# Patient Record
Sex: Male | Born: 1935 | Race: White | Hispanic: No | Marital: Married
Health system: Southern US, Community
[De-identification: ages and names within clinical notes are randomized; demographics above are authoritative.]

## PROBLEM LIST (undated history)

## (undated) DIAGNOSIS — I10 Essential (primary) hypertension: Secondary | ICD-10-CM

## (undated) DIAGNOSIS — I2510 Atherosclerotic heart disease of native coronary artery without angina pectoris: Secondary | ICD-10-CM

## (undated) DIAGNOSIS — N189 Chronic kidney disease, unspecified: Secondary | ICD-10-CM

## (undated) DIAGNOSIS — E119 Type 2 diabetes mellitus without complications: Secondary | ICD-10-CM

---

## 2007-08-23 ENCOUNTER — Ambulatory Visit: Payer: Self-pay | Admitting: Internal Medicine

## 2007-09-06 ENCOUNTER — Other Ambulatory Visit: Payer: Self-pay

## 2007-09-07 ENCOUNTER — Inpatient Hospital Stay: Payer: Self-pay | Admitting: Internal Medicine

## 2007-09-13 ENCOUNTER — Ambulatory Visit: Payer: Self-pay | Admitting: Internal Medicine

## 2007-09-23 ENCOUNTER — Ambulatory Visit: Payer: Self-pay | Admitting: Internal Medicine

## 2007-11-16 ENCOUNTER — Ambulatory Visit: Payer: Self-pay | Admitting: Gastroenterology

## 2010-03-10 ENCOUNTER — Ambulatory Visit: Payer: Self-pay | Admitting: Ophthalmology

## 2010-03-23 ENCOUNTER — Ambulatory Visit: Payer: Self-pay | Admitting: Ophthalmology

## 2012-09-06 ENCOUNTER — Encounter (HOSPITAL_COMMUNITY): Admission: AD | Disposition: E | Payer: Self-pay | Source: Other Acute Inpatient Hospital | Attending: Internal Medicine

## 2012-09-06 ENCOUNTER — Inpatient Hospital Stay (HOSPITAL_COMMUNITY)
Admission: AD | Admit: 2012-09-06 | Discharge: 2012-09-22 | DRG: 308 | Disposition: E | Payer: Medicare Other | Source: Other Acute Inpatient Hospital | Attending: Internal Medicine | Admitting: Internal Medicine

## 2012-09-06 ENCOUNTER — Encounter (HOSPITAL_COMMUNITY): Payer: Self-pay | Admitting: Internal Medicine

## 2012-09-06 ENCOUNTER — Inpatient Hospital Stay (HOSPITAL_COMMUNITY): Payer: Medicare Other

## 2012-09-06 ENCOUNTER — Emergency Department: Payer: Self-pay | Admitting: Emergency Medicine

## 2012-09-06 DIAGNOSIS — M3390 Dermatopolymyositis, unspecified, organ involvement unspecified: Secondary | ICD-10-CM | POA: Diagnosis present

## 2012-09-06 DIAGNOSIS — D696 Thrombocytopenia, unspecified: Secondary | ICD-10-CM | POA: Diagnosis present

## 2012-09-06 DIAGNOSIS — J9600 Acute respiratory failure, unspecified whether with hypoxia or hypercapnia: Secondary | ICD-10-CM | POA: Diagnosis present

## 2012-09-06 DIAGNOSIS — N189 Chronic kidney disease, unspecified: Secondary | ICD-10-CM

## 2012-09-06 DIAGNOSIS — J690 Pneumonitis due to inhalation of food and vomit: Secondary | ICD-10-CM | POA: Diagnosis present

## 2012-09-06 DIAGNOSIS — E119 Type 2 diabetes mellitus without complications: Secondary | ICD-10-CM | POA: Diagnosis present

## 2012-09-06 DIAGNOSIS — I517 Cardiomegaly: Secondary | ICD-10-CM

## 2012-09-06 DIAGNOSIS — I2510 Atherosclerotic heart disease of native coronary artery without angina pectoris: Secondary | ICD-10-CM

## 2012-09-06 DIAGNOSIS — N170 Acute kidney failure with tubular necrosis: Secondary | ICD-10-CM | POA: Diagnosis present

## 2012-09-06 DIAGNOSIS — R339 Retention of urine, unspecified: Secondary | ICD-10-CM | POA: Diagnosis present

## 2012-09-06 DIAGNOSIS — Z23 Encounter for immunization: Secondary | ICD-10-CM

## 2012-09-06 DIAGNOSIS — D539 Nutritional anemia, unspecified: Secondary | ICD-10-CM | POA: Diagnosis present

## 2012-09-06 DIAGNOSIS — I442 Atrioventricular block, complete: Principal | ICD-10-CM

## 2012-09-06 DIAGNOSIS — R55 Syncope and collapse: Secondary | ICD-10-CM

## 2012-09-06 DIAGNOSIS — M3313 Other dermatomyositis without myopathy: Secondary | ICD-10-CM | POA: Diagnosis present

## 2012-09-06 DIAGNOSIS — I129 Hypertensive chronic kidney disease with stage 1 through stage 4 chronic kidney disease, or unspecified chronic kidney disease: Secondary | ICD-10-CM | POA: Diagnosis present

## 2012-09-06 DIAGNOSIS — N184 Chronic kidney disease, stage 4 (severe): Secondary | ICD-10-CM | POA: Diagnosis present

## 2012-09-06 DIAGNOSIS — E872 Acidosis, unspecified: Secondary | ICD-10-CM | POA: Diagnosis present

## 2012-09-06 DIAGNOSIS — R918 Other nonspecific abnormal finding of lung field: Secondary | ICD-10-CM | POA: Diagnosis present

## 2012-09-06 DIAGNOSIS — R570 Cardiogenic shock: Secondary | ICD-10-CM | POA: Diagnosis present

## 2012-09-06 DIAGNOSIS — I10 Essential (primary) hypertension: Secondary | ICD-10-CM

## 2012-09-06 DIAGNOSIS — I959 Hypotension, unspecified: Secondary | ICD-10-CM | POA: Diagnosis present

## 2012-09-06 DIAGNOSIS — N179 Acute kidney failure, unspecified: Secondary | ICD-10-CM

## 2012-09-06 DIAGNOSIS — E873 Alkalosis: Secondary | ICD-10-CM | POA: Diagnosis present

## 2012-09-06 DIAGNOSIS — Z66 Do not resuscitate: Secondary | ICD-10-CM | POA: Diagnosis not present

## 2012-09-06 HISTORY — DX: Type 2 diabetes mellitus without complications: E11.9

## 2012-09-06 HISTORY — DX: Atherosclerotic heart disease of native coronary artery without angina pectoris: I25.10

## 2012-09-06 HISTORY — DX: Chronic kidney disease, unspecified: N18.9

## 2012-09-06 HISTORY — DX: Essential (primary) hypertension: I10

## 2012-09-06 LAB — POCT I-STAT 3, ART BLOOD GAS (G3+)
Acid-base deficit: 20 mmol/L — ABNORMAL HIGH (ref 0.0–2.0)
Acid-base deficit: 20 mmol/L — ABNORMAL HIGH (ref 0.0–2.0)
Bicarbonate: 10.4 mEq/L — ABNORMAL LOW (ref 20.0–24.0)
Bicarbonate: 8 mEq/L — ABNORMAL LOW (ref 20.0–24.0)
Bicarbonate: 7.4 mEq/L — ABNORMAL LOW (ref 20.0–24.0)
Patient temperature: 98.6
TCO2: 8 mmol/L (ref 0–100)
pCO2 arterial: 37.9 mmHg (ref 35.0–45.0)
pCO2 arterial: 26.7 mmHg — ABNORMAL LOW (ref 35.0–45.0)
pH, Arterial: 7.082 — CL (ref 7.350–7.450)
pH, Arterial: 7.147 — CL (ref 7.350–7.450)
pO2, Arterial: 234 mmHg — ABNORMAL HIGH (ref 80.0–100.0)
pO2, Arterial: 91 mmHg (ref 80.0–100.0)

## 2012-09-06 LAB — CBC WITH DIFFERENTIAL/PLATELET
Basophil %: 0.3 %
Basophils Absolute: 0 10*3/uL (ref 0.0–0.1)
Eosinophil #: 0.1 10*3/uL (ref 0.0–0.7)
Eosinophil %: 0.7 %
HCT: 27.9 % — ABNORMAL LOW (ref 39.0–52.0)
Lymphocyte %: 15.9 %
Lymphs Abs: 0.5 10*3/uL — ABNORMAL LOW (ref 0.7–4.0)
MCH: 34.8 pg — ABNORMAL HIGH (ref 26.0–34.0)
MCH: 34.7 pg — ABNORMAL HIGH (ref 26.0–34.0)
MCHC: 32.6 g/dL (ref 32.0–36.0)
MCHC: 33.3 g/dL (ref 30.0–36.0)
MCV: 107 fL — ABNORMAL HIGH (ref 80–100)
MCV: 104.1 fL — ABNORMAL HIGH (ref 78.0–100.0)
Monocyte #: 1 x10 3/mm (ref 0.2–1.0)
Monocyte %: 6.4 %
Monocytes Absolute: 0.9 10*3/uL (ref 0.1–1.0)
Monocytes Relative: 8 % (ref 3–12)
Neutro Abs: 10.2 10*3/uL — ABNORMAL HIGH (ref 1.7–7.7)
Neutrophil %: 76.7 %
RDW: 13.5 % (ref 11.5–14.5)
RDW: 12.9 % (ref 11.5–15.5)
WBC: 15.3 10*3/uL — ABNORMAL HIGH (ref 3.8–10.6)
WBC: 11.6 10*3/uL — ABNORMAL HIGH (ref 4.0–10.5)

## 2012-09-06 LAB — COMPREHENSIVE METABOLIC PANEL
ALT: 16 U/L (ref 0–53)
AST: 24 U/L (ref 0–37)
Albumin: 3.3 g/dL — ABNORMAL LOW (ref 3.4–5.0)
Albumin: 3.1 g/dL — ABNORMAL LOW (ref 3.5–5.2)
Alkaline Phosphatase: 34 U/L — ABNORMAL LOW (ref 39–117)
Anion Gap: 15 (ref 7–16)
BUN: 33 mg/dL — ABNORMAL HIGH (ref 7–18)
Bilirubin,Total: 0.2 mg/dL (ref 0.2–1.0)
Chloride: 108 mmol/L — ABNORMAL HIGH (ref 98–107)
Chloride: 106 mEq/L (ref 96–112)
Co2: 13 mmol/L — ABNORMAL LOW (ref 21–32)
Creatinine: 2.02 mg/dL — ABNORMAL HIGH (ref 0.60–1.30)
EGFR (Non-African Amer.): 30 — ABNORMAL LOW
Potassium: 6.2 mmol/L — ABNORMAL HIGH (ref 3.5–5.1)
Potassium: 6.9 mEq/L (ref 3.5–5.1)
SGPT (ALT): 16 U/L (ref 12–78)
Sodium: 136 mmol/L (ref 136–145)
Sodium: 134 mEq/L — ABNORMAL LOW (ref 135–145)
Total Protein: 5.4 g/dL — ABNORMAL LOW (ref 6.0–8.3)

## 2012-09-06 LAB — CARBOXYHEMOGLOBIN
Carboxyhemoglobin: 0.3 % — ABNORMAL LOW (ref 0.5–1.5)
Methemoglobin: 2 % — ABNORMAL HIGH (ref 0.0–1.5)
O2 Saturation: 98.9 %

## 2012-09-06 LAB — BASIC METABOLIC PANEL
BUN: 30 mg/dL — ABNORMAL HIGH (ref 6–23)
CO2: 8 mEq/L — CL (ref 19–32)
Calcium: 8.1 mg/dL — ABNORMAL LOW (ref 8.4–10.5)
Chloride: 106 mEq/L (ref 96–112)
Creatinine, Ser: 1.42 mg/dL — ABNORMAL HIGH (ref 0.50–1.35)
Glucose, Bld: 413 mg/dL — ABNORMAL HIGH (ref 70–99)

## 2012-09-06 LAB — PROTIME-INR
INR: 1
INR: 1.24 (ref 0.00–1.49)
Prothrombin Time: 13.6 secs (ref 11.5–14.7)
Prothrombin Time: 15.4 seconds — ABNORMAL HIGH (ref 11.6–15.2)

## 2012-09-06 LAB — GLUCOSE, CAPILLARY
Glucose-Capillary: 135 mg/dL — ABNORMAL HIGH (ref 70–99)
Glucose-Capillary: 289 mg/dL — ABNORMAL HIGH (ref 70–99)

## 2012-09-06 LAB — PROCALCITONIN: Procalcitonin: 0.37 ng/mL

## 2012-09-06 LAB — TSH: TSH: 1.716 u[IU]/mL (ref 0.350–4.500)

## 2012-09-06 LAB — URINE MICROSCOPIC-ADD ON

## 2012-09-06 LAB — URINALYSIS, ROUTINE W REFLEX MICROSCOPIC
Nitrite: NEGATIVE
Specific Gravity, Urine: 1.026 (ref 1.005–1.030)
Urobilinogen, UA: 0.2 mg/dL (ref 0.0–1.0)
pH: 5 (ref 5.0–8.0)

## 2012-09-06 LAB — PRO B NATRIURETIC PEPTIDE: Pro B Natriuretic peptide (BNP): 2207 pg/mL — ABNORMAL HIGH (ref 0–450)

## 2012-09-06 LAB — CK TOTAL AND CKMB (NOT AT ARMC): CK, Total: 47 U/L (ref 35–232)

## 2012-09-06 LAB — POTASSIUM: Potassium: 4.8 mmol/L (ref 3.5–5.1)

## 2012-09-06 LAB — HEMOGLOBIN A1C: Mean Plasma Glucose: 134 mg/dL — ABNORMAL HIGH (ref <117)

## 2012-09-06 LAB — TROPONIN I: Troponin I: 1.01 ng/mL (ref <0.30)

## 2012-09-06 LAB — INFLUENZA PANEL BY PCR (TYPE A & B): H1N1 flu by pcr: NOT DETECTED

## 2012-09-06 LAB — MRSA PCR SCREENING: MRSA by PCR: NEGATIVE

## 2012-09-06 SURGERY — PERMANENT PACEMAKER INSERTION
Anesthesia: LOCAL

## 2012-09-06 MED ORDER — NOREPINEPHRINE BITARTRATE 1 MG/ML IJ SOLN
2.0000 ug/min | INTRAVENOUS | Status: DC | PRN
  Administered 2012-09-06: 10 ug/min via INTRAVENOUS
  Administered 2012-09-08: 7 ug/min via INTRAVENOUS
  Filled 2012-09-06 (×3): qty 4

## 2012-09-06 MED ORDER — HEPARIN (PORCINE) IN NACL 100-0.45 UNIT/ML-% IJ SOLN
900.0000 [IU]/h | INTRAMUSCULAR | Status: DC
  Administered 2012-09-06: 1000 [IU]/h via INTRAVENOUS
  Administered 2012-09-07: 950 [IU]/h via INTRAVENOUS
  Administered 2012-09-07 – 2012-09-08 (×2): 900 [IU]/h via INTRAVENOUS
  Filled 2012-09-06 (×5): qty 250

## 2012-09-06 MED ORDER — FENTANYL CITRATE 0.05 MG/ML IJ SOLN
INTRAMUSCULAR | Status: AC
  Administered 2012-09-06: 100 ug
  Filled 2012-09-06: qty 2

## 2012-09-06 MED ORDER — DOPAMINE-DEXTROSE 3.2-5 MG/ML-% IV SOLN: 2.0000 ug/kg/min | INTRAVENOUS | Status: DC

## 2012-09-06 MED ORDER — LEVOFLOXACIN IN D5W 750 MG/150ML IV SOLN
750.0000 mg | INTRAVENOUS | Status: DC
  Administered 2012-09-06: 750 mg via INTRAVENOUS
  Filled 2012-09-06: qty 150

## 2012-09-06 MED ORDER — SODIUM CHLORIDE 0.9 % IV SOLN
25.0000 ug/h | INTRAVENOUS | Status: DC
  Administered 2012-09-06: 50 ug/h via INTRAVENOUS
  Filled 2012-09-06: qty 50

## 2012-09-06 MED ORDER — DOBUTAMINE IN D5W 4-5 MG/ML-% IV SOLN: 2.5000 ug/kg/min | INTRAVENOUS | Status: DC | PRN

## 2012-09-06 MED ORDER — SUCCINYLCHOLINE CHLORIDE 20 MG/ML IJ SOLN
INTRAMUSCULAR | Status: AC
  Filled 2012-09-06: qty 1

## 2012-09-06 MED ORDER — SODIUM CHLORIDE 0.9 % IV SOLN
2.0000 ug/min | INTRAVENOUS | Status: DC
  Administered 2012-09-06 – 2012-09-07 (×5): 10 ug/min via INTRAVENOUS
  Administered 2012-09-08: 5 ug/min via INTRAVENOUS
  Filled 2012-09-06 (×6): qty 4

## 2012-09-06 MED ORDER — HEPARIN BOLUS VIA INFUSION
4000.0000 [IU] | Freq: Once | INTRAVENOUS | Status: AC
  Administered 2012-09-06: 4000 [IU] via INTRAVENOUS
  Filled 2012-09-06: qty 4000

## 2012-09-06 MED ORDER — ETOMIDATE 2 MG/ML IV SOLN
INTRAVENOUS | Status: AC
  Administered 2012-09-06: 20 mg
  Filled 2012-09-06: qty 20

## 2012-09-06 MED ORDER — PIPERACILLIN-TAZOBACTAM 3.375 G IVPB 30 MIN
3.3750 g | Freq: Three times a day (TID) | INTRAVENOUS | Status: DC
  Administered 2012-09-06 – 2012-09-08 (×6): 3.375 g via INTRAVENOUS
  Filled 2012-09-06 (×8): qty 50

## 2012-09-06 MED ORDER — OSELTAMIVIR PHOSPHATE 6 MG/ML PO SUSR
75.0000 mg | Freq: Two times a day (BID) | ORAL | Status: DC
  Administered 2012-09-06 – 2012-09-07 (×3): 75 mg via ORAL
  Filled 2012-09-06 (×6): qty 12.5

## 2012-09-06 MED ORDER — ALBUTEROL SULFATE (5 MG/ML) 0.5% IN NEBU
2.5000 mg | INHALATION_SOLUTION | RESPIRATORY_TRACT | Status: DC | PRN
  Administered 2012-09-07: 2.5 mg via RESPIRATORY_TRACT
  Filled 2012-09-06: qty 0.5

## 2012-09-06 MED ORDER — PANTOPRAZOLE SODIUM 40 MG IV SOLR
40.0000 mg | INTRAVENOUS | Status: DC
  Administered 2012-09-06 – 2012-09-08 (×3): 40 mg via INTRAVENOUS
  Filled 2012-09-06 (×4): qty 40

## 2012-09-06 MED ORDER — INSULIN ASPART 100 UNIT/ML ~~LOC~~ SOLN
5.0000 [IU] | Freq: Once | SUBCUTANEOUS | Status: AC
  Administered 2012-09-06: 5 [IU] via INTRAVENOUS

## 2012-09-06 MED ORDER — SODIUM CHLORIDE 0.9 % IV BOLUS (SEPSIS): 500.0000 mL | INTRAVENOUS | Status: DC | PRN

## 2012-09-06 MED ORDER — OSELTAMIVIR PHOSPHATE 75 MG PO CAPS
75.0000 mg | ORAL_CAPSULE | Freq: Two times a day (BID) | ORAL | Status: DC
  Filled 2012-09-06 (×2): qty 1

## 2012-09-06 MED ORDER — SODIUM CHLORIDE 0.9 % IV BOLUS (SEPSIS)
500.0000 mL | Freq: Once | INTRAVENOUS | Status: DC
  Administered 2012-09-06: 12:00:00 via INTRAVENOUS

## 2012-09-06 MED ORDER — MIDAZOLAM HCL 2 MG/2ML IJ SOLN
1.0000 mg | INTRAMUSCULAR | Status: DC | PRN
  Administered 2012-09-06 (×6): 2 mg via INTRAVENOUS
  Administered 2012-09-08: 0.5 mg via INTRAVENOUS
  Filled 2012-09-06 (×7): qty 2

## 2012-09-06 MED ORDER — SODIUM BICARBONATE 8.4 % IV SOLN
INTRAVENOUS | Status: DC
  Administered 2012-09-06 – 2012-09-07 (×3): via INTRAVENOUS
  Filled 2012-09-06 (×6): qty 150

## 2012-09-06 MED ORDER — SODIUM CHLORIDE 0.9 % IV SOLN
INTRAVENOUS | Status: DC
  Administered 2012-09-06: 10 mL/h via INTRAVENOUS
  Administered 2012-09-07: 10:00:00 via INTRAVENOUS

## 2012-09-06 MED ORDER — SODIUM POLYSTYRENE SULFONATE 15 GM/60ML PO SUSP
30.0000 g | Freq: Once | ORAL | Status: AC
  Administered 2012-09-06: 30 g
  Filled 2012-09-06: qty 120

## 2012-09-06 MED ORDER — DEXTROSE 50 % IV SOLN
1.0000 | Freq: Once | INTRAVENOUS | Status: AC
  Administered 2012-09-06: 50 mL via INTRAVENOUS
  Filled 2012-09-06: qty 50

## 2012-09-06 MED ORDER — VANCOMYCIN HCL 1000 MG IV SOLR
750.0000 mg | Freq: Two times a day (BID) | INTRAVENOUS | Status: DC
  Filled 2012-09-06: qty 750

## 2012-09-06 MED ORDER — PIPERACILLIN-TAZOBACTAM 3.375 G IVPB
3.3750 g | Freq: Three times a day (TID) | INTRAVENOUS | Status: DC
  Filled 2012-09-06: qty 50

## 2012-09-06 MED ORDER — INSULIN ASPART 100 UNIT/ML ~~LOC~~ SOLN: 1.0000 [IU] | SUBCUTANEOUS | Status: DC

## 2012-09-06 MED ORDER — INSULIN ASPART 100 UNIT/ML ~~LOC~~ SOLN
0.0000 [IU] | SUBCUTANEOUS | Status: DC
  Administered 2012-09-06: 9 [IU] via SUBCUTANEOUS
  Administered 2012-09-06 (×2): 5 [IU] via SUBCUTANEOUS
  Administered 2012-09-06: 7 [IU] via SUBCUTANEOUS
  Administered 2012-09-06: 9 [IU] via SUBCUTANEOUS
  Administered 2012-09-07 (×3): 5 [IU] via SUBCUTANEOUS
  Administered 2012-09-07: 3 [IU] via SUBCUTANEOUS
  Administered 2012-09-07 – 2012-09-08 (×2): 5 [IU] via SUBCUTANEOUS
  Administered 2012-09-08: 7 [IU] via SUBCUTANEOUS
  Administered 2012-09-08 (×2): 3 [IU] via SUBCUTANEOUS
  Administered 2012-09-08 (×3): 5 [IU] via SUBCUTANEOUS
  Administered 2012-09-09: 2 [IU] via SUBCUTANEOUS
  Administered 2012-09-09: 5 [IU] via SUBCUTANEOUS

## 2012-09-06 MED ORDER — SODIUM CHLORIDE 0.9 % IV SOLN
1.0000 g | Freq: Once | INTRAVENOUS | Status: AC
  Administered 2012-09-06: 1 g via INTRAVENOUS
  Filled 2012-09-06: qty 10

## 2012-09-06 MED ORDER — LIDOCAINE HCL 2 % EX GEL
Freq: Once | CUTANEOUS | Status: AC
  Administered 2012-09-06: 5 via TOPICAL
  Filled 2012-09-06: qty 5

## 2012-09-06 MED ORDER — LEVOFLOXACIN IN D5W 750 MG/150ML IV SOLN
750.0000 mg | INTRAVENOUS | Status: DC
  Administered 2012-09-08: 750 mg via INTRAVENOUS
  Filled 2012-09-06: qty 150

## 2012-09-06 MED ORDER — FENTANYL CITRATE 0.05 MG/ML IJ SOLN
25.0000 ug | INTRAMUSCULAR | Status: DC | PRN
  Administered 2012-09-06 (×4): 50 ug via INTRAVENOUS
  Administered 2012-09-08: 25 ug via INTRAVENOUS
  Filled 2012-09-06 (×5): qty 2

## 2012-09-06 MED ORDER — ROCURONIUM BROMIDE 50 MG/5ML IV SOLN
INTRAVENOUS | Status: AC
  Filled 2012-09-06: qty 2

## 2012-09-06 MED ORDER — HYDROCORTISONE SOD SUCCINATE 100 MG IJ SOLR
50.0000 mg | Freq: Four times a day (QID) | INTRAMUSCULAR | Status: DC
  Administered 2012-09-06: 50 mg via INTRAVENOUS
  Administered 2012-09-06: 10:00:00 via INTRAVENOUS
  Administered 2012-09-06 – 2012-09-07 (×5): 50 mg via INTRAVENOUS
  Filled 2012-09-06 (×9): qty 1

## 2012-09-06 MED ORDER — ASPIRIN 325 MG PO TABS
325.0000 mg | ORAL_TABLET | Freq: Every day | ORAL | Status: DC
  Administered 2012-09-06 – 2012-09-08 (×3): 325 mg
  Filled 2012-09-06 (×4): qty 1

## 2012-09-06 MED ORDER — LIDOCAINE HCL (CARDIAC) 20 MG/ML IV SOLN
INTRAVENOUS | Status: AC
  Filled 2012-09-06: qty 5

## 2012-09-06 MED ORDER — VANCOMYCIN HCL 10 G IV SOLR
1250.0000 mg | Freq: Once | INTRAVENOUS | Status: AC
  Administered 2012-09-06: 1250 mg via INTRAVENOUS
  Filled 2012-09-06: qty 1250

## 2012-09-06 MED ORDER — VANCOMYCIN HCL 10 G IV SOLR
1250.0000 mg | INTRAVENOUS | Status: DC
  Administered 2012-09-07: 1250 mg via INTRAVENOUS
  Filled 2012-09-06 (×2): qty 1250

## 2012-09-06 MED ORDER — ASPIRIN 300 MG RE SUPP
300.0000 mg | Freq: Every day | RECTAL | Status: DC
  Filled 2012-09-06: qty 1

## 2012-09-06 MED ORDER — MIDAZOLAM HCL 2 MG/2ML IJ SOLN
INTRAMUSCULAR | Status: AC
  Administered 2012-09-06: 2 mg
  Filled 2012-09-06: qty 2

## 2012-09-06 MED FILL — Dopamine in Dextrose 5% Inj 3.2 MG/ML: INTRAVENOUS | Qty: 250 | Status: AC

## 2012-09-06 NOTE — Progress Notes (Signed)
Patient ID: William Collins, male   DOB: September 08, 1926, 79 y.o.   MRN: [id number] William Collins is an 79 y.o. male.   Chief Complaint: CHB HPI: The patient is an 79 year old man who presented to the hospital with hypotension and complete heart block after experiencing an episode of syncope. According to his wife, he has been feeling poorly for the last week. He did not seek medical attention. Earlier this morning, he got up to go to the bathroom and collapsed. He was found by paramedics to have complete heart block with a heart rate of 30 beats per minute. He was taken to the emergency department and a transcutaneous pacemaker was placed. There is intermittent capture. He remained hypotensive. I was called to see the patient where he was found to be poorly responsive, withdrawing to pain and not answering questions. Laboratory evaluation demonstrated an elevated creatinine. He underwent emergent temporary transvenous pacemaker insertion by way of the right internal jugular vein with fluoroscopic guidance. He was transferred to this institution for additional evaluation. He remains hypotensive despite satisfactory pacemaker function. Our critical care medicine services help with the patient, placing central venous access and arterial monitoring. Because of failure to protect his airway and acidemia, he underwent insertion of an endotracheal tube. He remains critically ill.  Past Medical History  Diagnosis Date  . HTN (hypertension)   . CKD (chronic kidney disease)   . DM (diabetes mellitus)   . CAD (coronary artery disease)     No past surgical history on file.  No family history on file. Social History:  does not have a smoking history on file. He does not have any smokeless tobacco history on file. His alcohol and drug histories not on file.  Allergies: No Known Allergies  Medications Prior to Admission  Medication Sig Dispense Refill  . acetaminophen (TYLENOL) 325 MG tablet Take 650 mg by mouth  every 4 (four) hours as needed. For pain      . alendronate (FOSAMAX) 70 MG tablet Take 70 mg by mouth every 7 (seven) days. Take with a full glass of water on an empty stomach.      Marland Kitchen aspirin 81 MG tablet Take 81 mg by mouth daily.      . Cholecalciferol 10000 UNITS CAPS Take 1,000 Units by mouth daily.      Marland Kitchen glipiZIDE (GLUCOTROL) 10 MG tablet Take 10 mg by mouth 2 (two) times daily before a meal.      . lisinopril (PRINIVIL,ZESTRIL) 5 MG tablet Take 5 mg by mouth daily.      . metFORMIN (GLUCOPHAGE) 1000 MG tablet Take 1,000 mg by mouth 2 (two) times daily with a meal.      . metoprolol (LOPRESSOR) 50 MG tablet Take 50 mg by mouth 2 (two) times daily.      . nitroGLYCERIN (NITROSTAT) 0.4 MG SL tablet Place 0.4 mg under the tongue every 5 (five) minutes as needed.      . nortriptyline (PAMELOR) 25 MG capsule Take 25 mg by mouth at bedtime.      Marland Kitchen omeprazole (PRILOSEC) 20 MG capsule Take 20 mg by mouth daily.      . simvastatin (ZOCOR) 80 MG tablet Take 80 mg by mouth at bedtime.      . traZODone (DESYREL) 100 MG tablet Take 100 mg by mouth at bedtime.        Results for orders placed during the hospital encounter of 2012-10-04 (from the past 48 hour(s))  MRSA PCR  SCREENING     Status: Normal   Collection Time   08/30/2012  7:16 AM      Component Value Range Comment   MRSA by PCR NEGATIVE  NEGATIVE   POCT I-STAT 3, BLOOD GAS (G3+)     Status: Abnormal   Collection Time   09/08/2012  8:08 AM      Component Value Range Comment   pH, Arterial 7.048 (*) 7.350 - 7.450    pCO2 arterial 37.9  35.0 - 45.0 mmHg    pO2, Arterial 234.0 (*) 80.0 - 100.0 mmHg    Bicarbonate 10.4 (*) 20.0 - 24.0 mEq/L    TCO2 12  0 - 100 mmol/L    O2 Saturation 99.0      Acid-base deficit 19.0 (*) 0.0 - 2.0 mmol/L    Patient temperature 98.6 F      Collection site ARTERIAL LINE      Drawn by RT      Sample type ARTERIAL      Comment NOTIFIED PHYSICIAN     LACTIC ACID, PLASMA     Status: Abnormal   Collection Time    09/05/2012  8:25 AM      Component Value Range Comment   Lactic Acid, Venous 5.6 (*) 0.5 - 2.2 mmol/L   POCT I-STAT 3, BLOOD GAS (G3+)     Status: Abnormal   Collection Time   09/15/2012  9:11 AM      Component Value Range Comment   pH, Arterial 7.082 (*) 7.350 - 7.450    pCO2 arterial 26.7 (*) 35.0 - 45.0 mmHg    pO2, Arterial 91.0  80.0 - 100.0 mmHg    Bicarbonate 8.0 (*) 20.0 - 24.0 mEq/L    TCO2 9  0 - 100 mmol/L    O2 Saturation 93.0      Acid-base deficit 20.0 (*) 0.0 - 2.0 mmol/L    Patient temperature 98.6 F      Collection site ARTERIAL LINE      Drawn by RT      Sample type ARTERIAL      Comment NOTIFIED PHYSICIAN     COMPREHENSIVE METABOLIC PANEL     Status: Abnormal   Collection Time   08/24/2012  9:45 AM      Component Value Range Comment   Sodium 134 (*) 135 - 145 mEq/L    Potassium 6.9 (*) 3.5 - 5.1 mEq/L    Chloride 106  96 - 112 mEq/L    CO2 9 (*) 19 - 32 mEq/L    Glucose, Bld 357 (*) 70 - 99 mg/dL    BUN 31 (*) 6 - 23 mg/dL    Creatinine, Ser 1.47 (*) 0.50 - 1.35 mg/dL    Calcium 7.1 (*) 8.4 - 10.5 mg/dL    Total Protein 5.4 (*) 6.0 - 8.3 g/dL    Albumin 3.1 (*) 3.5 - 5.2 g/dL    AST 24  0 - 37 U/L    ALT 16  0 - 53 U/L    Alkaline Phosphatase 34 (*) 39 - 117 U/L    Total Bilirubin 0.3  0.3 - 1.2 mg/dL    GFR calc non Af Amer 41 (*) >90 mL/min    GFR calc Af Amer 47 (*) >90 mL/min   CBC WITH DIFFERENTIAL     Status: Abnormal   Collection Time   08/27/2012  9:45 AM      Component Value Range Comment   WBC 11.6 (*) 4.0 -  10.5 K/uL    RBC 2.68 (*) 4.22 - 5.81 MIL/uL    Hemoglobin 9.3 (*) 13.0 - 17.0 g/dL    HCT 16.1 (*) 09.6 - 52.0 %    MCV 104.1 (*) 78.0 - 100.0 fL    MCH 34.7 (*) 26.0 - 34.0 pg    MCHC 33.3  30.0 - 36.0 g/dL    RDW 04.5  40.9 - 81.1 %    Platelets 130 (*) 150 - 400 K/uL    Neutrophils Relative 88 (*) 43 - 77 %    Lymphocytes Relative 4 (*) 12 - 46 %    Monocytes Relative 8  3 - 12 %    Eosinophils Relative 0  0 - 5 %    Basophils Relative  0  0 - 1 %    Neutro Abs 10.2 (*) 1.7 - 7.7 K/uL    Lymphs Abs 0.5 (*) 0.7 - 4.0 K/uL    Monocytes Absolute 0.9  0.1 - 1.0 K/uL    Eosinophils Absolute 0.0  0.0 - 0.7 K/uL    Basophils Absolute 0.0  0.0 - 0.1 K/uL    RBC Morphology BURR CELLS      WBC Morphology INCREASED BANDS (>20% BANDS)      Smear Review PLATELETS APPEAR DECREASED     TROPONIN I     Status: Abnormal   Collection Time   09/05/2012  9:45 AM      Component Value Range Comment   Troponin I 1.01 (*) <0.30 ng/mL   PROTIME-INR     Status: Abnormal   Collection Time   08/30/2012  9:45 AM      Component Value Range Comment   Prothrombin Time 15.4 (*) 11.6 - 15.2 seconds    INR 1.24  0.00 - 1.49   APTT     Status: Normal   Collection Time   08/31/2012  9:45 AM      Component Value Range Comment   aPTT 24  24 - 37 seconds   PRO B NATRIURETIC PEPTIDE     Status: Abnormal   Collection Time   09/02/2012  9:45 AM      Component Value Range Comment   Pro B Natriuretic peptide (BNP) 2207.0 (*) 0 - 450 pg/mL   POCT I-STAT 3, BLOOD GAS (G3+)     Status: Abnormal   Collection Time   08/30/2012 10:22 AM      Component Value Range Comment   pH, Arterial 7.147 (*) 7.350 - 7.450    pCO2 arterial 21.3 (*) 35.0 - 45.0 mmHg    pO2, Arterial 95.0  80.0 - 100.0 mmHg    Bicarbonate 7.4 (*) 20.0 - 24.0 mEq/L    TCO2 8  0 - 100 mmol/L    O2 Saturation 95.0      Acid-base deficit 20.0 (*) 0.0 - 2.0 mmol/L    Patient temperature 98.6 F      Collection site ARTERIAL LINE      Drawn by RT      Sample type ARTERIAL      Comment NOTIFIED PHYSICIAN     GLUCOSE, CAPILLARY     Status: Abnormal   Collection Time   09/11/2012 10:41 AM      Component Value Range Comment   Glucose-Capillary 354 (*) 70 - 99 mg/dL    Dg Chest Port 1 View  09/16/2012  **ADDENDUM** CREATED: 09/08/2012 10:25:08  Discussed with referring physician.  The patient's right IJ line reflects a transvenous pacer.  As such, it is in satisfactory position, and no adjustment is necessary.   **END ADDENDUM** SIGNED BY: Charline Bills, M.D.   10-05-2012  *RADIOLOGY REPORT*  Clinical Data: New central line, bibasilar infiltrates  PORTABLE CHEST - 1 VIEW  Comparison: None.  Findings: Endotracheal tube terminates 3 cm above the carina.  Patchy right lower lobe opacity, possibly a combination of atelectasis and pleural effusion.  The heart is normal in size.  Defibrillator pad overlies the left hemithorax.  Suspected right IJ venous catheter terminates in the right ventricle.  Withdrawal approximately 12 cm is suggested.  Additional catheter tubing overlying the right clavicle is presumed to be external to the patient.  IMPRESSION: Endotracheal tube terminates 3 cm above the carina.  Suspected right IJ venous catheter terminates in the right ventricle.  Withdrawal approximately 12 cm is suggested.  Patchy right lower lobe opacity, possibly a combination of atelectasis and pleural effusion.  These results will be called to the ordering clinician or representative by the Radiologist Assistant, and communication documented in the PACS Dashboard.   Original Report Authenticated By: Charline Bills, M.D.     Unable to obtain review of systems except as already noted in the history of present illness.  Physical exam  Blood pressure 96/50, pulse 99, resp. rate 28, weight 183 lb 13.8 oz (83.4 kg), SpO2 99.00%. Ill appearing elderly man, endotracheal tube is in place. HEENT: Unremarkable Neck:  8 cm JVD, no thyromegally, temporary transvenous pacing leads are in place Lungs:  Scattered rales and rhonchi. HEART:  Regular rate rhythm, no murmurs, no rubs, no clicks Abd:  Flat, positive bowel sounds, no organomegally, no rebound, no guarding Ext:  2 plus pulses, no edema, no cyanosis, no clubbing Skin:  No rashes no nodules Neuro:  Unresponsive except to deep painful stimuli  EKG: Ventricular pacing  Assessment/Plan 1. complete heart block 2. hypotension, shock 3. renal insufficiency,  questionable acute versus acute on chronic 4. ventilatory dependent respiratory failure Rec: If the patient survives, he will need to undergo insertion of a permanent pacemaker. He is currently critically ill. His troponin is minimally elevated, but I doubt this is a primary coronary ischemic event. He is acidemic and hypotensive. Blood cultures and empiric antibiotics have been started. We will obtain a 2-D echo to evaluate his left ventricular function. We'll plan to provide overall supportive care at this point. I discussed situation with his family.  Lewayne Bunting 10/05/2012, 11:21 AM

## 2012-09-06 NOTE — Care Management Note (Signed)
    Page 1 of 1   09-16-12     10:42:11 AM   CARE MANAGEMENT NOTE Sep 16, 2012  Patient:  William Collins, William Collins   Account Number:  [id number]  Date Initiated:  2012-09-16  Documentation initiated by:  Junius Creamer  Subjective/Objective Assessment:   adm w resp failure, on vent     Action/Plan:   lives w wife   Anticipated DC Date:     Anticipated DC Plan:        DC Planning Services  CM consult      Choice offered to / List presented to:             Status of service:   Medicare Important Message given?   (If response is "NO", the following Medicare IM given date fields will be blank) Date Medicare IM given:   Date Additional Medicare IM given:    Discharge Disposition:    Per UR Regulation:  Reviewed for med. necessity/level of care/duration of stay  If discussed at Long Length of Stay Meetings, dates discussed:    Comments:  1/16 1041 debbie Ewing Fandino rn,bsn

## 2012-09-06 NOTE — Progress Notes (Signed)
Pt presented to 2900 via carelink from [facility]. Pt on dopamine at . B/p on admit 88/40. Heart rate 77( see flow shee)t pt on external pacer and placed on Zol. Dr Ladona Ridgel in to see patient. Dr Tyson Alias CCM notified and in to see pt pt pale shallow resp. Pt unstable and intubated TL central line placed in left femoral due to possible perm pacemaker placement on left subclavian. Rt arterial line placed. Pt only response to painful stimuli . See assessment.pt placed on sepsis protocal

## 2012-09-06 NOTE — Procedures (Signed)
Central Venous Catheter Insertion Procedure Note William Collins [id number] [birth year]  Procedure: Insertion of Central Venous Catheter Indications: Drug and/or fluid administration and Frequent blood sampling  Procedure Details Consent: Unable to obtain consent because of emergent medical necessity. Time Out: Verified patient identification, verified procedure, site/side was marked, verified correct patient position, special equipment/implants available, medications/allergies/relevent history reviewed, required imaging and test results available.  Performed  Maximum sterile technique was used including antiseptics, cap, gloves, gown, hand hygiene, mask and sheet. Skin prep: Chlorhexidine; local anesthetic administered A antimicrobial bonded/coated triple lumen catheter was placed in the left femoral vein due to emergent situation using the Seldinger technique.  Evaluation Blood flow good Complications: No apparent complications Patient did tolerate procedure well. Chest X-ray ordered to verify placement.  CXR: pending.  William Collins 09/15/12, 9:26 AM  US guidance Need pacer left IOj for EP  William Collins. William Alias, MD, FACP Pgr: [phone] [facility]

## 2012-09-06 NOTE — Consult Note (Signed)
Urology Consult  Referring physician:   Dr. Almyra Deforest Reason for referral:  Inability to pass foley  Chief Complaint:  Acute urinary retention  History of Present Illness:   Patient ID: William Collins, male DOB: 10-16-1926, 79 y.o. MRN: [id number]  Chief Complaint: CHB  HPI: The patient is an 79 year old man who presented to the hospital with hypotension and complete heart block after experiencing an episode of syncope. According to his wife, he has been feeling poorly for the last week. He did not seek medical attention. Earlier this morning, he got up to go to the bathroom and collapsed. He was found by paramedics to have complete heart block with a heart rate of 30 beats per minute. He was taken to the emergency department and a transcutaneous pacemaker was placed. There is intermittent capture. He remained hypotensive. I was called to see the patient where he was found to be poorly responsive, withdrawing to pain and not answering questions. Laboratory evaluation demonstrated an elevated creatinine. He underwent emergent temporary transvenous pacemaker insertion by way of the right internal jugular vein with fluoroscopic guidance. He was transferred to this institution for additional evaluation. He remains hypotensive despite satisfactory pacemaker function. Our critical care medicine services help with the patient, placing central venous access and arterial monitoring. Because of failure to protect his airway and acidemia, he underwent insertion of an endotracheal tube. He remains critically ill.  The pt had multiple attempts to pass a foley catheter without success both at [facility], and in 2900 at [facility]. He is intubated, and sedated, and is unable to give any Urologic history. He has no family member present.    Past Medical History  Diagnosis Date  . HTN (hypertension)   . CKD (chronic kidney disease)   . DM (diabetes mellitus)   . CAD (coronary artery disease)    No  past surgical history on file.  Medications: I have reviewed the patient's current medications. Allergies: No Known Allergies  No family history on file. Social History:  does not have a smoking history on file. He does not have any smokeless tobacco history on file. His alcohol and drug histories not on file.  ROS: All systems are reviewed and negative except as noted. Pt is unalel to speak. No family member is present.    Physical Exam:  Vital signs in last 24 hours: Temp:  [95.1 F (35.1 C)-99.1 F (37.3 C)] 99.1 F (37.3 C) (01/16 1600) Pulse Rate:  [80-104] 99  (01/16 1800) Resp:  [0-35] 35  (01/16 1800) BP: (78-137)/(17-71) 137/71 mmHg (01/16 1639) SpO2:  [97 %-100 %] 100 % (01/16 1800) Arterial Line BP: (96-135)/(51-71) 125/70 mmHg (01/16 1800) FiO2 (%):  [30 %-100 %] 30 % (01/16 1639) Weight:  [83.4 kg (183 lb 13.8 oz)] 83.4 kg (183 lb 13.8 oz) (01/16 0825)  Cardiovascular: Skin warm; not flushed Respiratory: Breaths quiet; no shortness of breath Abdomen: No masses Neurological: Normal sensation to touch Musculoskeletal: Normal motor function arms and legs Lymphatics: No inguinal adenopathy Skin: No rashes Genitourinary:  Uncircumcised. Normal scrotum.   Laboratory Data:  Results for orders placed during the hospital encounter of 09/17/2012 (from the past 72 hour(s))  MRSA PCR SCREENING     Status: Normal   Collection Time   08/30/2012  7:16 AM      Component Value Range Comment   MRSA by PCR NEGATIVE  NEGATIVE   POCT I-STAT 3, BLOOD GAS (G3+)     Status: Abnormal  Collection Time   09/12/2012  8:08 AM      Component Value Range Comment   pH, Arterial 7.048 (*) 7.350 - 7.450    pCO2 arterial 37.9  35.0 - 45.0 mmHg    pO2, Arterial 234.0 (*) 80.0 - 100.0 mmHg    Bicarbonate 10.4 (*) 20.0 - 24.0 mEq/L    TCO2 12  0 - 100 mmol/L    O2 Saturation 99.0      Acid-base deficit 19.0 (*) 0.0 - 2.0 mmol/L    Patient temperature 98.6 F      Collection site ARTERIAL LINE        Drawn by RT      Sample type ARTERIAL      Comment NOTIFIED PHYSICIAN     LACTIC ACID, PLASMA     Status: Abnormal   Collection Time   09/11/2012  8:25 AM      Component Value Range Comment   Lactic Acid, Venous 5.6 (*) 0.5 - 2.2 mmol/L   POCT I-STAT 3, BLOOD GAS (G3+)     Status: Abnormal   Collection Time   09/05/2012  9:11 AM      Component Value Range Comment   pH, Arterial 7.082 (*) 7.350 - 7.450    pCO2 arterial 26.7 (*) 35.0 - 45.0 mmHg    pO2, Arterial 91.0  80.0 - 100.0 mmHg    Bicarbonate 8.0 (*) 20.0 - 24.0 mEq/L    TCO2 9  0 - 100 mmol/L    O2 Saturation 93.0      Acid-base deficit 20.0 (*) 0.0 - 2.0 mmol/L    Patient temperature 98.6 F      Collection site ARTERIAL LINE      Drawn by RT      Sample type ARTERIAL      Comment NOTIFIED PHYSICIAN     TSH     Status: Normal   Collection Time   09/08/2012  9:32 AM      Component Value Range Comment   TSH 1.716  0.350 - 4.500 uIU/mL   COMPREHENSIVE METABOLIC PANEL     Status: Abnormal   Collection Time   08/29/2012  9:45 AM      Component Value Range Comment   Sodium 134 (*) 135 - 145 mEq/L    Potassium 6.9 (*) 3.5 - 5.1 mEq/L    Chloride 106  96 - 112 mEq/L    CO2 9 (*) 19 - 32 mEq/L    Glucose, Bld 357 (*) 70 - 99 mg/dL    BUN 31 (*) 6 - 23 mg/dL    Creatinine, Ser 1.61 (*) 0.50 - 1.35 mg/dL    Calcium 7.1 (*) 8.4 - 10.5 mg/dL    Total Protein 5.4 (*) 6.0 - 8.3 g/dL    Albumin 3.1 (*) 3.5 - 5.2 g/dL    AST 24  0 - 37 U/L    ALT 16  0 - 53 U/L    Alkaline Phosphatase 34 (*) 39 - 117 U/L    Total Bilirubin 0.3  0.3 - 1.2 mg/dL    GFR calc non Af Amer 41 (*) >90 mL/min    GFR calc Af Amer 47 (*) >90 mL/min   CBC WITH DIFFERENTIAL     Status: Abnormal   Collection Time   08/25/2012  9:45 AM      Component Value Range Comment   WBC 11.6 (*) 4.0 - 10.5 K/uL    RBC 2.68 (*) 4.22 - 5.81 MIL/uL  Hemoglobin 9.3 (*) 13.0 - 17.0 g/dL    HCT 16.1 (*) 09.6 - 52.0 %    MCV 104.1 (*) 78.0 - 100.0 fL    MCH 34.7 (*) 26.0 -  34.0 pg    MCHC 33.3  30.0 - 36.0 g/dL    RDW 04.5  40.9 - 81.1 %    Platelets 130 (*) 150 - 400 K/uL    Neutrophils Relative 88 (*) 43 - 77 %    Lymphocytes Relative 4 (*) 12 - 46 %    Monocytes Relative 8  3 - 12 %    Eosinophils Relative 0  0 - 5 %    Basophils Relative 0  0 - 1 %    Neutro Abs 10.2 (*) 1.7 - 7.7 K/uL    Lymphs Abs 0.5 (*) 0.7 - 4.0 K/uL    Monocytes Absolute 0.9  0.1 - 1.0 K/uL    Eosinophils Absolute 0.0  0.0 - 0.7 K/uL    Basophils Absolute 0.0  0.0 - 0.1 K/uL    RBC Morphology BURR CELLS      WBC Morphology INCREASED BANDS (>20% BANDS)      Smear Review PLATELETS APPEAR DECREASED     TROPONIN I     Status: Abnormal   Collection Time   09/18/2012  9:45 AM      Component Value Range Comment   Troponin I 1.01 (*) <0.30 ng/mL   PROTIME-INR     Status: Abnormal   Collection Time   09/18/12  9:45 AM      Component Value Range Comment   Prothrombin Time 15.4 (*) 11.6 - 15.2 seconds    INR 1.24  0.00 - 1.49   APTT     Status: Normal   Collection Time   09-18-2012  9:45 AM      Component Value Range Comment   aPTT 24  24 - 37 seconds   PRO B NATRIURETIC PEPTIDE     Status: Abnormal   Collection Time   09/18/12  9:45 AM      Component Value Range Comment   Pro B Natriuretic peptide (BNP) 2207.0 (*) 0 - 450 pg/mL   CORTISOL     Status: Normal   Collection Time   09-18-12  9:45 AM      Component Value Range Comment   Cortisol, Plasma 26.7     PROCALCITONIN     Status: Normal   Collection Time   09/18/2012  9:45 AM      Component Value Range Comment   Procalcitonin 0.37     HEMOGLOBIN A1C     Status: Abnormal   Collection Time   09-18-12  9:45 AM      Component Value Range Comment   Hemoglobin A1C 6.3 (*) <5.7 %    Mean Plasma Glucose 134 (*) <117 mg/dL   POCT I-STAT 3, BLOOD GAS (G3+)     Status: Abnormal   Collection Time   2012/09/18 10:22 AM      Component Value Range Comment   pH, Arterial 7.147 (*) 7.350 - 7.450    pCO2 arterial 21.3 (*) 35.0 - 45.0 mmHg     pO2, Arterial 95.0  80.0 - 100.0 mmHg    Bicarbonate 7.4 (*) 20.0 - 24.0 mEq/L    TCO2 8  0 - 100 mmol/L    O2 Saturation 95.0      Acid-base deficit 20.0 (*) 0.0 - 2.0 mmol/L    Patient temperature 98.6 F  Collection site ARTERIAL LINE      Drawn by RT      Sample type ARTERIAL      Comment NOTIFIED PHYSICIAN     GLUCOSE, CAPILLARY     Status: Abnormal   Collection Time   09/05/2012 10:41 AM      Component Value Range Comment   Glucose-Capillary 354 (*) 70 - 99 mg/dL   GLUCOSE, CAPILLARY     Status: Abnormal   Collection Time   09/12/2012 11:33 AM      Component Value Range Comment   Glucose-Capillary 135 (*) 70 - 99 mg/dL   BASIC METABOLIC PANEL     Status: Abnormal   Collection Time   09/21/2012 12:30 PM      Component Value Range Comment   Sodium 132 (*) 135 - 145 mEq/L    Potassium 5.6 (*) 3.5 - 5.1 mEq/L    Chloride 106  96 - 112 mEq/L    CO2 8 (*) 19 - 32 mEq/L    Glucose, Bld 413 (*) 70 - 99 mg/dL    BUN 30 (*) 6 - 23 mg/dL    Creatinine, Ser 2.13 (*) 0.50 - 1.35 mg/dL    Calcium 8.1 (*) 8.4 - 10.5 mg/dL    GFR calc non Af Amer 44 (*) >90 mL/min    GFR calc Af Amer 51 (*) >90 mL/min   GLUCOSE, CAPILLARY     Status: Abnormal   Collection Time   09/15/2012 12:38 PM      Component Value Range Comment   Glucose-Capillary 374 (*) 70 - 99 mg/dL   CARBOXYHEMOGLOBIN     Status: Abnormal   Collection Time   08/25/2012  1:20 PM      Component Value Range Comment   Total hemoglobin 8.9 (*) 13.5 - 18.0 g/dL    O2 Saturation 08.6      Carboxyhemoglobin 0.3 (*) 0.5 - 1.5 %    Methemoglobin 2.0 (*) 0.0 - 1.5 %   TROPONIN I     Status: Abnormal   Collection Time   09/08/2012  1:31 PM      Component Value Range Comment   Troponin I 5.12 (*) <0.30 ng/mL   GLUCOSE, CAPILLARY     Status: Abnormal   Collection Time   09/18/2012  3:28 PM      Component Value Range Comment   Glucose-Capillary 312 (*) 70 - 99 mg/dL    Recent Results (from the past 240 hour(s))  MRSA PCR SCREENING      Status: Normal   Collection Time   09/05/2012  7:16 AM      Component Value Range Status Comment   MRSA by PCR NEGATIVE  NEGATIVE Final    Creatinine:  Basename 09/14/2012 1230 08/27/2012 0945  CREATININE 1.42* 1.51*    Impression/Assessment:  Acute urinary retention. Pt has blood at the urethral meatus. He has had multiple failed attempts to pass foley.   Plan:    Emergency bedside cystoscopy and foley catheter passage.   Jethro Bolus I 09/04/2012, 7:33 PM

## 2012-09-06 NOTE — Progress Notes (Signed)
Attempted coude catheter insertion. Penis retracted, blood noted before insertion. Met resistance, unable to advance catheter, moderate amount of blood noted from penis on catheter tip. Unsuccessful insertion of coude catheter. Patient's assigned RN aware. Hedy Camara [id number]

## 2012-09-06 NOTE — Procedures (Signed)
Intubation Procedure Note Dervin Vore [id number] 1927-04-11  Procedure: Intubation Indications: Respiratory insufficiency  Procedure Details Consent: Unable to obtain consent because of emergent medical necessity. Time Out: Verified patient identification, verified procedure, site/side was marked, verified correct patient position, special equipment/implants available, medications/allergies/relevent history reviewed, required imaging and test results available.  Performed  Maximum sterile technique was used including gown, hand hygiene and mask.  MAC and 4    Evaluation Hemodynamic Status: Persistent hypotension treated with pressors; O2 sats: stable throughout Patient's Current Condition: unstable Complications: No apparent complications Patient did tolerate procedure well. Chest X-ray ordered to verify placement.  CXR: pending.   [name]. 08/27/2012  8.5 ETT No asp Irven [place]. Tyson Alias, MD, FACP Pgr: [phone] [facility]

## 2012-09-06 NOTE — Progress Notes (Signed)
*  PRELIMINARY RESULTS* Echocardiogram 2D Echocardiogram has been performed.  Jeryl Columbia 09/11/2012, 10:15 AM

## 2012-09-06 NOTE — Op Note (Signed)
Pre-operative diagnosis :   Acute urinary retention with multiple failed catheterization attempts  Postoperative diagnosis:  Same.   Operation:  Flexible bedside cystoscopy and passage of  19F Council catheter.   Surgeon:  Kathie Rhodes. Patsi Sears, MD  First assistant:  none  Anesthesia:   IV MO4  Preparation:  After pre-medication, the penis was prepped with betadine solution, and draped in the usual fashion. The armband was double checked. Review history:  79 yo male admitted with Complete heart block, and is intubated. He cannot be catheterized, and requires cystoscopy for direct vision catheterization.   Statement of  Likelihood of Success: Excellent. TIME-OUT observed.:  Procedure:  The flexible cystoscope was then placed through the external urethral meatus, and the pendulous urethra appeared reddened, with multiple areas of false passages. The posterior urethra appeared wnl, and the prostate had bilobar enlargement. No median lobe was identified. The bladder was entered, and the trigone was wnl, and no stone, tumor or diverticulum was seen. A 0.38 Sensor guidewire was then passed into the bladder and the scope was removed. An 56  F Council catheter was then passed atraumatically into the bladder, and 10cc of sterile water was placed into the balloon. The catheter was then placed to straight drainage.  The patient tolerated the procedure well, and the catheter was left to straight drainage.

## 2012-09-06 NOTE — Procedures (Signed)
Central Venous Catheter Insertion Procedure Note William Collins [id number] 19-Apr-1927  Procedure: Insertion of Central Venous Catheter Indications: Assessment of intravascular volume and Drug and/or fluid administration  Procedure Details Consent: Unable to obtain consent because of emergent medical necessity. Time Out: Verified patient identification, verified procedure, site/side was marked, verified correct patient position, special equipment/implants available, medications/allergies/relevent history reviewed, required imaging and test results available.  Performed  Maximum sterile technique was used including antiseptics, cap, gloves, gown, hand hygiene, mask and sheet. Skin prep: Chlorhexidine; local anesthetic administered A antimicrobial bonded/coated triple lumen catheter was NOT placed in the left internal jugular vein using the Seldinger technique. Because concerns arose from EP for ned left IJ site for perm pacer. Will obtain  cvp from rt ij , place fem.  Evaluation Blood flow good Complications: No apparent complications Patient did tolerate procedure well. Chest X-ray ordered to verify placement.  CXR: pending.  [name] 2012/09/22, 8:21 AM  [place]  Mcarthur Rossetti. Tyson Alias, MD, FACP Pgr: [phone] [facility]

## 2012-09-06 NOTE — Progress Notes (Addendum)
ANTIBIOTIC CONSULT NOTE - INITIAL  Pharmacy Consult for vancomycin/zosyn Indication: rule out pneumonia   Patient Measurements: Wt= 83.4kg  Vital Signs: BP: 94/43 mmHg (01/16 0800) Pulse Rate: 99  (01/16 0800)  Labs: No results found for this basename: WBC:3,HGB:3,PLT:3,LABCREA:3,CREATININE:3 in the last 72 hours  Microbiology: No results found for this or any previous visit (from the past 720 hour(s)).  Medical History: -DM -HTN -CAD  Medications:  -medication list pending  Assessment: 79 yo male with CHB transferred from [place] with suspected PNA to start vancomycin and zosyn. Labs are not yet available. Per noted from [place], patient noted with CKD stage IV. SCr= 1.51, CrCl~ 35-40 ml/min.  Goal of Therapy:  Vancomycin trough level 15-20 mcg/ml  Plan:  -Zosyn 3.375gm IV q8h  and vancomycin 1250mg  IV q24hr -Will change levaquin to 750mg  IV q48h -Will follow culture results, renal function and clinical progress  Harland German, Pharm D 09/05/2012 8:44 AM

## 2012-09-06 NOTE — Procedures (Signed)
Arterial Catheter Insertion Procedure Note William Collins [id number] 11/14/1926  Procedure: Insertion of Arterial Catheter  Indications: Blood pressure monitoring  Procedure Details Consent: Unable to obtain consent because of emergent medical necessity. Time Out: Verified patient identification, verified procedure, site/side was marked, verified correct patient position, special equipment/implants available, medications/allergies/relevent history reviewed, required imaging and test results available.  Performed  Maximum sterile technique was used including antiseptics, cap, gloves, gown, hand hygiene, mask and sheet. Skin prep: Chlorhexidine; local anesthetic administered 20 gauge catheter was inserted into right femoral artery using the Seldinger technique.  Evaluation Blood flow good; BP tracing good. Complications: No apparent complications.  No radial pulse  William Collins. 09/12/2012  William Collins. Tyson Alias, MD, FACP Pgr: [phone] [facility]

## 2012-09-06 NOTE — Progress Notes (Signed)
ANTICOAGULATION CONSULT NOTE - Initial Consult  Pharmacy Consult for Heparin Indication: chest pain/ACS  No Known Allergies  Patient Measurements: Height: 5\' 9"  (175.3 cm) Weight: 183 lb 13.8 oz (83.4 kg) IBW/kg (Calculated) : 70.7   Vital Signs: Temp: 98.1 F (36.7 C) (01/16 1529) Temp src: Oral (01/16 1529) BP: 137/71 mmHg (01/16 1639) Pulse Rate: 99  (01/16 1639)  Labs:  Basename 09/17/2012 1331 09/15/2012 1230 09/05/2012 0945  HGB -- -- 9.3*  HCT -- -- 27.9*  PLT -- -- 130*  APTT -- -- 24  LABPROT -- -- 15.4*  INR -- -- 1.24  HEPARINUNFRC -- -- --  CREATININE -- 1.42* 1.51*  CKTOTAL -- -- --  CKMB -- -- --  TROPONINI 5.12* -- 1.01*    Estimated Creatinine Clearance: 39.4 ml/min (by C-G formula based on Cr of 1.42).   Medical History: Past Medical History  Diagnosis Date  . HTN (hypertension)   . CKD (chronic kidney disease)   . DM (diabetes mellitus)   . CAD (coronary artery disease)     Medications:  Scheduled:    . aspirin  325 mg Per Tube Daily  . [COMPLETED] calcium chloride  IV  1 g Intravenous Once  . [COMPLETED] dextrose  1 ampule Intravenous Once  . [COMPLETED] etomidate      . [COMPLETED] fentaNYL      . hydrocortisone sodium succinate  50 mg Intravenous Q6H  . insulin aspart  0-9 Units Subcutaneous Q4H  . [COMPLETED] insulin aspart  5 Units Intravenous Once  . levofloxacin (LEVAQUIN) IV  750 mg Intravenous Q48H  . lidocaine (cardiac) 100 mg/16ml      . [COMPLETED] lidocaine   Topical Once  . [COMPLETED] midazolam      . oseltamivir  75 mg Oral BID  . pantoprazole (PROTONIX) IV  40 mg Intravenous Q24H  . piperacillin-tazobactam  3.375 g Intravenous Q8H  . rocuronium      . [COMPLETED] sodium chloride  500 mL Intravenous Once  . [COMPLETED] sodium polystyrene  30 g Per Tube Once  . succinylcholine      . [COMPLETED] vancomycin  1,250 mg Intravenous Once  . vancomycin  1,250 mg Intravenous Q24H  . [DISCONTINUED] aspirin  300 mg Rectal Daily    . [DISCONTINUED] insulin aspart  1-3 Units Subcutaneous Q4H  . [DISCONTINUED] levofloxacin (LEVAQUIN) IV  750 mg Intravenous Q24H  . [DISCONTINUED] oseltamivir  75 mg Oral BID  . [DISCONTINUED] piperacillin-tazobactam (ZOSYN)  IV  3.375 g Intravenous Q8H  . [DISCONTINUED] vancomycin  750 mg Intravenous Q12H    Assessment: 79 year old male admitted with complete heart block and hypotension, now to begin anticoagulation with Heparin for elevated troponins.  Goal of Therapy:  Heparin level 0.3-0.7 units/ml Monitor platelets by anticoagulation protocol: Yes   Plan:  Heparin 4000 units IV bolus x 1 Start Heparin infusion at 1000 units/hr Check Heparin level in 8 hours Daily Heparin level and CBC  Estella Husk, Pharm.D., BCPS Clinical Pharmacist  Phone [phone] Pager [phone] 08/23/2012, 5:26 PM

## 2012-09-06 NOTE — Consult Note (Signed)
PULMONARY  / CRITICAL CARE MEDICINE  Name: William Collins MRN: [id number] DOB: 1927-06-25    LOS: 0  REFERRING MD :  Dr Ladona Ridgel   CHIEF COMPLAINT:  Acute Respiratory failure , Shock   BRIEF PATIENT DESCRIPTION:  79 year old male with PMH significant for CAD,  DM, HTN , CKD stage IV, who was found down by wife on the floor at home with fecal material and confusion. EMS gave IV Atropin for HR in 30s . Hypotensive and complete Heart block . IV dopamine started and transcutaneous pacemake placed.  [facility] transported by [facility]. On arrival patient developed acute respiratory failure and was intubated. PCCM was consulted.   LINES / TUBES: ETT 1/16 >> Left femoral (placed to slavage left for pacer perm)1/16>> Right  femoral A line 1/16 >> Right pacer line 1/16>>>  CULTURES: Blood cx 1/16>> Resp cx 1/16 >> Urine cx 1/16 >> Resp viral panel 1/16 >> Urine leg 1/16 >> Urine strep 1/16 >>  ANTIBIOTICS: Vanc 1/16 >> Zosyn 1/16 >> Tamiflu 1/16 >>  SIGNIFICANT EVENTS:  1/16 Arrived to floor with respiratory distress and Hypotensive on Levo and Dopamine   LEVEL OF CARE:  ICU PRIMARY SERVICE:  [facility] CONSULTANTS:  PCCM  CODE STATUS: Full  DIET:  NPO DVT Px:  SCD  GI Px:  Protonix   HISTORY OF PRESENT ILLNESS:    79 year old male with PMH significant for CAD,  DM, HTN , CKD stage IV, who was found down by wife on the floor at home with fecal material and confusion. EMS gave IV Atropin for HR in 30s . Hypotensive and complete Heart block . IV dopamine started and transcutaneous pacemake placed.   ECG: complete heart block,  CXray pulmonary vascular congestion.  Crea 2, K 6.2, Sugar 320 , Hgb 9.3, Platlets 107 , Ca 8.2, Albumin 3.3  CT head: no acute findings CT spine : negative for acute findings Cxray congestion  Was given atropine, dopamine, insulin  ( 6 units ) , 3 liters of NS , Ativan   PAST MEDICAL HISTORY :  Past Medical History  Diagnosis  Date  . HTN (hypertension)   . CKD (chronic kidney disease)   . DM (diabetes mellitus)   . CAD (coronary artery disease)    No past surgical history on file. Prior to Admission medications   Not on File   No Known Allergies  FAMILY HISTORY:  No family history on file. SOCIAL HISTORY:  does not have a smoking history on file. He does not have any smokeless tobacco history on file. His alcohol and drug histories not on file.  REVIEW OF SYSTEMS:  Sedated, acidotic, high pressor needs  INTERVAL HISTORY:   VITAL SIGNS: Pulse Rate:  [80-99] 99  (01/16 0825) Resp:  [0-28] 28  (01/16 0825) BP: (78-103)/(17-53) 96/50 mmHg (01/16 0825) SpO2:  [99 %-100 %] 99 % (01/16 0825) Arterial Line BP: (97)/(57) 97/57 mmHg (01/16 0825) FiO2 (%):  [40 %-100 %] 40 % (01/16 0941) Weight:  [183 lb 13.8 oz (83.4 kg)] 183 lb 13.8 oz (83.4 kg) (01/16 0825) HEMODYNAMICS:   VENTILATOR SETTINGS: Vent Mode:  [-] PRVC FiO2 (%):  [40 %-100 %] 40 % Set Rate:  [16 bmp-35 bmp] 35 bmp Vt Set:  [600 mL] 600 mL PEEP:  [5 cmH20] 5 cmH20 Plateau Pressure:  [19 cmH20] 19 cmH20 INTAKE / OUTPUT: Intake/Output    None     PHYSICAL EXAMINATION: General: ill appearing  male. agonal Neuro:  Unconscious, moves upper ext HEENT: PERRL , MM dry  Cardiovascular:  Paced at 80 , no murmurs noted  Lungs:  Rhonchi present, decreased air movement, no wheezing  Abdomen:  Soft, BS present Musculoskeletal: moving extremities  Skin:  Intact   LABS: Cbc Pending   Chemistry Pending   Liver fxn Pending  coags penidng  Sepsis markers Pending  Cardiac markers Pending   BNP Pending   ABG  Lab 2012-10-01 0911 2012-10-01 0808  PHART 7.082* 7.048*  PCO2ART 26.7* 37.9  PO2ART 91.0 234.0*  HCO3 8.0* 10.4*  TCO2 9 12     IMAGING:  Chest xray 1/16  Clinical Data: New central line, bibasilar infiltrates  PORTABLE CHEST - 1 VIEW  Comparison: None.  Findings: Endotracheal tube terminates 3 cm above the carina.    Patchy right lower lobe opacity, possibly a combination of  atelectasis and pleural effusion.  The heart is normal in size.  Defibrillator pad overlies the left hemithorax.  Suspected right IJ venous catheter terminates in the right  ventricle. Withdrawal approximately 12 cm is suggested.  Additional catheter tubing overlying the right clavicle is presumed  to be external to the patient.  IMPRESSION:  Endotracheal tube terminates 3 cm above the carina.  Suspected right IJ venous catheter terminates in the right  ventricle. Withdrawal approximately 12 cm is suggested.  Patchy right lower lobe opacity, possibly a combination of  atelectasis and pleural effusion.  ECG: Ventricular paced rhythm   DIAGNOSES: Active Problems:  HTN (hypertension)  DM (dermatomyositis)  CKD (chronic kidney disease)  CAD (coronary artery disease)  Acute respiratory failure  Hypotension  Complete heart block  Pulmonary infiltrate   ASSESSMENT / PLAN:  PULMONARY  ASSESSMENT: Acute respiratory failure  Pulmonary infiltrate   PLAN:   Full vent support with elevated minute ventilation to compensate for acidosis Repeat ABG to ensure correcting ph Cxray in am Antibiotics and cultures as per ID section   CARDIOVASCULAR  ASSESSMENT:  Complete Heart block s/p temporal wire placement Hypotension  Ischemia ?  Hx of CAD Hx HTN  PLAN:  Dopamine gtt max 10 mcg/hr, al alpha above this, and ineffective, keep 8 mic/kg.min Levo gtt to map goals Emergent A line Increased pacer HR to 100 to increase CO ECG per cards Troponin q6hrs  Solu cortef 50 q6h until cortisol back Follow lactic acid trend Consider EGDT with infiltrates and refractory shock Asa No beta blocker Anticoagulation per cards  RENAL  ASSESSMENT:  CKD unclear stage Acidosis ph of 7 PLAN:   F/u labs,LFT Full vent support  cvp assessment   GASTROINTESTINAL  ASSESSMENT:   No acute issues  PLAN:   Monitor  ppi lft npo  HEMATOLOGIC  ASSESSMENT:  Thrombocytopenia ?  Anemia ? PLAN:  F/u CBC  scd for now  INFECTIOUS  ASSESSMENT:   Pulmonary infiltrates  Hypotension  Presumed septic shock PLAN:   Zenaida Niece and Zosyn, Tamiflu  Blood cx, Urine cx, Resp culture, Respiratory viral panel, Urine leg, Urine strep   ENDOCRINE R/o rel AI ASSESSMENT:   Stress steroids  PLAN:   SSI  F/u  Cortisol level, TSH (brady, low threshold lyme ana etc pending trop)  NEUROLOGIC  ASSESSMENT:  CT and CT neck negative from outside hospital   PLAN:   Sedated with  fentanyl and versed prn  Limit benoz CT head done  I have personally obtained a history, examined the patient, evaluated laboratory and imaging results, formulated the assessment and plan and  placed orders. CRITICAL CARE: The patient is critically ill with multiple organ systems failure and requires high complexity decision making for assessment and support, frequent evaluation and titration of therapies, application of advanced monitoring technologies and extensive interpretation of multiple databases. Critical Care Time devoted to patient care services described in this note is 85 minutes.   Mcarthur Rossetti. Tyson Alias, MD, FACP Pgr: [phone] [facility]  Pulmonary and Critical Care Medicine [facility] Pager: [phone]  09/15/2012, 10:08 AM

## 2012-09-06 NOTE — Progress Notes (Signed)
Code status discussion with family:  I discussed with Daughter William Collins ( in [place] ) and Wife William Collins about patient wishes and both noted that he  Did not want to be a ""vegetable" . He made it clear to everyone. 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 The Daughter note she was not sure about the DNR status and deferred to the wife William Collins. She underlined that whatever she would decide she would back her up.  The wife decided to continue current therapy but was clear that patient should be DNR/DNI .   I placed the order .   Almyra Deforest , MD PGY III Internal Medicine Resident  09/07/11  05:15 pm

## 2012-09-07 ENCOUNTER — Inpatient Hospital Stay (HOSPITAL_COMMUNITY): Payer: Medicare Other

## 2012-09-07 DIAGNOSIS — R918 Other nonspecific abnormal finding of lung field: Secondary | ICD-10-CM

## 2012-09-07 DIAGNOSIS — I959 Hypotension, unspecified: Secondary | ICD-10-CM

## 2012-09-07 LAB — HEPARIN LEVEL (UNFRACTIONATED)
Heparin Unfractionated: 0.72 IU/mL — ABNORMAL HIGH (ref 0.30–0.70)
Heparin Unfractionated: 0.68 IU/mL (ref 0.30–0.70)
Heparin Unfractionated: 0.62 IU/mL (ref 0.30–0.70)

## 2012-09-07 LAB — BASIC METABOLIC PANEL
BUN: 19 mg/dL (ref 6–23)
CO2: 14 mEq/L — ABNORMAL LOW (ref 19–32)
CO2: 19 mEq/L (ref 19–32)
Chloride: 107 mEq/L (ref 96–112)
Chloride: 101 mEq/L (ref 96–112)
Creatinine, Ser: 1.24 mg/dL (ref 0.50–1.35)
GFR calc Af Amer: 58 mL/min — ABNORMAL LOW (ref >90)
Glucose, Bld: 335 mg/dL — ABNORMAL HIGH (ref 70–99)
Potassium: 4.2 mEq/L (ref 3.5–5.1)

## 2012-09-07 LAB — BLOOD GAS, ARTERIAL
Bicarbonate: 14.6 mEq/L — ABNORMAL LOW (ref 20.0–24.0)
Bicarbonate: 15.4 mEq/L — ABNORMAL LOW (ref 20.0–24.0)
O2 Saturation: 99.1 %
PEEP: 5 cmH2O
Patient temperature: 98.6
TCO2: 15.2 mmol/L (ref 0–100)
TCO2: 16.1 mmol/L (ref 0–100)
pCO2 arterial: 20.1 mmHg — ABNORMAL LOW (ref 35.0–45.0)
pH, Arterial: 7.503 — ABNORMAL HIGH (ref 7.350–7.450)
pH, Arterial: 7.498 — ABNORMAL HIGH (ref 7.350–7.450)
pO2, Arterial: 133 mmHg — ABNORMAL HIGH (ref 80.0–100.0)
pO2, Arterial: 137 mmHg — ABNORMAL HIGH (ref 80.0–100.0)

## 2012-09-07 LAB — CBC WITH DIFFERENTIAL/PLATELET
Basophils Absolute: 0 10*3/uL (ref 0.0–0.1)
Basophils Relative: 0 % (ref 0–1)
Lymphocytes Relative: 9 % — ABNORMAL LOW (ref 12–46)
MCHC: 36 g/dL (ref 30.0–36.0)
Neutro Abs: 9.1 10*3/uL — ABNORMAL HIGH (ref 1.7–7.7)
Neutrophils Relative %: 84 % — ABNORMAL HIGH (ref 43–77)
Platelets: 108 10*3/uL — ABNORMAL LOW (ref 150–400)
RDW: 12.8 % (ref 11.5–15.5)
WBC: 10.8 10*3/uL — ABNORMAL HIGH (ref 4.0–10.5)

## 2012-09-07 LAB — POCT I-STAT 3, ART BLOOD GAS (G3+)
Acid-base deficit: 6 mmol/L — ABNORMAL HIGH (ref 0.0–2.0)
Bicarbonate: 18 mEq/L — ABNORMAL LOW (ref 20.0–24.0)
Bicarbonate: 19.4 mEq/L — ABNORMAL LOW (ref 20.0–24.0)
O2 Saturation: 94 %
O2 Saturation: 93 %
Patient temperature: 98
TCO2: 20 mmol/L (ref 0–100)
pCO2 arterial: 30.6 mmHg — ABNORMAL LOW (ref 35.0–45.0)
pCO2 arterial: 34.9 mmHg — ABNORMAL LOW (ref 35.0–45.0)
pO2, Arterial: 73 mmHg — ABNORMAL LOW (ref 80.0–100.0)

## 2012-09-07 LAB — GLUCOSE, CAPILLARY
Glucose-Capillary: 250 mg/dL — ABNORMAL HIGH (ref 70–99)
Glucose-Capillary: 285 mg/dL — ABNORMAL HIGH (ref 70–99)

## 2012-09-07 LAB — TROPONIN I
Troponin I: 15.98 ng/mL (ref <0.30)
Troponin I: 8.94 ng/mL (ref <0.30)

## 2012-09-07 LAB — LEGIONELLA ANTIGEN, URINE

## 2012-09-07 LAB — CBC
HCT: 26.6 % — ABNORMAL LOW (ref 39.0–52.0)
Hemoglobin: 9.2 g/dL — ABNORMAL LOW (ref 13.0–17.0)
MCH: 34.3 pg — ABNORMAL HIGH (ref 26.0–34.0)
MCHC: 34.6 g/dL (ref 30.0–36.0)

## 2012-09-07 MED ORDER — ACETAMINOPHEN 325 MG PO TABS: 650.0000 mg | ORAL_TABLET | Freq: Four times a day (QID) | ORAL | Status: DC | PRN

## 2012-09-07 MED ORDER — BIOTENE DRY MOUTH MT LIQD
15.0000 mL | Freq: Four times a day (QID) | OROMUCOSAL | Status: DC
  Administered 2012-09-07 – 2012-09-08 (×5): 15 mL via OROMUCOSAL

## 2012-09-07 MED ORDER — HYDROCORTISONE SOD SUCCINATE 100 MG IJ SOLR
50.0000 mg | Freq: Two times a day (BID) | INTRAMUSCULAR | Status: DC
  Administered 2012-09-08: 50 mg via INTRAVENOUS
  Filled 2012-09-07 (×3): qty 1

## 2012-09-07 MED ORDER — ONDANSETRON HCL 4 MG/2ML IJ SOLN
4.0000 mg | Freq: Four times a day (QID) | INTRAMUSCULAR | Status: DC | PRN
  Administered 2012-09-07: 4 mg via INTRAVENOUS
  Filled 2012-09-07: qty 2

## 2012-09-07 MED ORDER — SODIUM CHLORIDE 0.9 % IV SOLN
INTRAVENOUS | Status: DC | PRN
  Administered 2012-09-07: 07:00:00 via INTRAVENOUS

## 2012-09-07 MED ORDER — SODIUM CHLORIDE 0.9 % IV SOLN
INTRAVENOUS | Status: DC
  Administered 2012-09-07: 19:00:00 via INTRAVENOUS

## 2012-09-07 MED ORDER — PNEUMOCOCCAL VAC POLYVALENT 25 MCG/0.5ML IJ INJ
0.5000 mL | INJECTION | INTRAMUSCULAR | Status: AC
  Administered 2012-09-08: 0.5 mL via INTRAMUSCULAR
  Filled 2012-09-07: qty 0.5

## 2012-09-07 MED ORDER — CHLORHEXIDINE GLUCONATE 0.12 % MT SOLN
15.0000 mL | Freq: Two times a day (BID) | OROMUCOSAL | Status: DC
  Administered 2012-09-07 – 2012-09-08 (×4): 15 mL via OROMUCOSAL
  Filled 2012-09-07 (×4): qty 15

## 2012-09-07 MED ORDER — MAGNESIUM SULFATE 40 MG/ML IJ SOLN
2.0000 g | Freq: Once | INTRAMUSCULAR | Status: AC
  Administered 2012-09-08: 2 g via INTRAVENOUS
  Filled 2012-09-07: qty 50

## 2012-09-07 MED ORDER — DOCUSATE SODIUM 100 MG PO CAPS
100.0000 mg | ORAL_CAPSULE | Freq: Two times a day (BID) | ORAL | Status: DC | PRN
  Filled 2012-09-07: qty 1

## 2012-09-07 NOTE — Progress Notes (Signed)
Name: William Collins MRN: [id number] DOB: June 28, 1927 LOS: 1  PCCM RESIDENT DAILY PROGRESS NOTE  History of Present Illness:  79 year old male with PMH significant for CAD, DM, HTN , CKD stage IV, who was found down by wife on the floor at home with fecal material and confusion. EMS gave IV Atropin for HR in 30s . Hypotensive and complete Heart block . IV dopamine started and transcutaneous pacemake placed. Patient was transported by [facility] evaluation. On arrival patient developed acute respiratory failure and was intubated. PCCM was consulted.   LINES / TUBES:  ETT 1/16 >> 1/17 Left femoral (placed to slavage left for pacer perm)1/16>>  Right femoral A line 1/16 >> 1/17 Right pacer line 1/16>>>   CULTURES:  Blood cx 1/16>> NTD Resp cx 1/16 >> Mixed flora Urine cx 1/16 >> NTD Resp viral panel 1/16 >>  Urine leg 1/16 >> Neg Urine strep 1/16 >> negative  Influenza nasal  1/16 >>swab negative  ANTIBIOTICS:  Vanc 1/16 >>  Zosyn 1/16 >>  Tamiflu 1/16 >>  Levaquin 1/15>>>  SIGNIFICANT EVENTS:  CT head and Neck 1/16 from  [facility] : negative for acute process 1/16 Arrived to floor with respiratory distress and Hypotensive on Levo and Dopamine > intubated  1/16 After discussion  with patient's wife : DNR status  1/16 2 D echo : Left ventricle: The cavity size was normal. There appeared to be septal-lateral dyssynchrony. The mid to apical inferior wall appears akinetic, mid to apical septum severely hypokinetic, apical wall severely hypokinetic, lateral wall kinetic. Other walls poorly visualized.Systolic function was severely reduced. The estimated ejection fraction was in the range of 25% to 30%.    Overnight Events:  - Trop elevated to 20 > started on Heparin  - Patient more awake and alert  - Resp alkalosis pH 7.5 PCO218 > Decrease RR to 25  - Temp of 101.3  Vital Signs: Temp:  [95.1 F (35.1 C)-101.8 F (38.8 C)] 101.3 F (38.5 C) (01/17  0447) Pulse Rate:  [80-104] 99  (01/17 0600) Resp:  [0-35] 25  (01/17 0600) BP: (78-137)/(40-71) 137/71 mmHg (01/16 1639) SpO2:  [97 %-100 %] 100 % (01/17 0600) Arterial Line BP: (96-135)/(51-71) 114/64 mmHg (01/17 0600) FiO2 (%):  [30 %-100 %] 30 % (01/17 0340) Weight:  [183 lb 13.8 oz (83.4 kg)-190 lb 14.7 oz (86.6 kg)] 190 lb 14.7 oz (86.6 kg) (01/17 0447) I/O last 3 completed shifts: In: 3058.8 [I.V.:2338.8; NG/GT:60; IV Piggyback:660] Out: 2120 [Urine:2120]  Physical Examination: General: Awake  Neuro: Awake and Alert  HEENT:ETT in place  Cardiovascular: Paced at 100 , no murmurs noted  Lungs: Rhonchi present, decreased air movement, no wheezing  Abdomen: Soft, BS present  Ventilator settings: Vent Mode:  [-] PRVC FiO2 (%):  [30 %-100 %] 30 % Set Rate:  [16 bmp-35 bmp] 25 bmp Vt Set:  [600 mL] 600 mL PEEP:  [5 cmH20] 5 cmH20 Plateau Pressure:  [19 cmH20-29 cmH20] 26 cmH20  Basename 09/07/12 0534 09/07/12 0328 08/23/2012 1022  PHART 7.498* 7.503* 7.147*  PO2ART 137.0* 133.0* 95.0  TCO2 16.1 15.2 8  HCO3 15.4* 14.6* 7.4*   Labs and Imaging:   Basic Metabolic Panel:  Lab 09/07/12 4098 09/03/2012 1230  NA 135 132*  K 4.2 5.6*  CL 107 106  CO2 14* 8*  GLUCOSE 289* 413*  BUN 23 30*  CREATININE 1.28 1.42*  CALCIUM 7.5* 8.1*  MG -- --  PHOS -- --  Liver Function Tests:  Lab 09-25-12 0945  AST 24  ALT 16  ALKPHOS 34*  BILITOT 0.3  PROT 5.4*  ALBUMIN 3.1*    CBC:  Lab 09/07/12 0445 09/25/12 0945  WBC 10.8* 11.6*  NEUTROABS 9.1* 10.2*  HGB 8.5* 9.3*  HCT 23.6* 27.9*  MCV 96.3 104.1*  PLT 108* 130*   Cardiac Enzymes:  Lab Sep 25, 2012 2315 09/25/2012 1331 09-25-2012 0945  CKTOTAL -- -- --  CKMB -- -- --  CKMBINDEX -- -- --  TROPONINI >20.00* 5.12* 1.01*   BNP:  Lab September 25, 2012 0945  PROBNP 2207.0*    CBG:  Lab 09/07/12 0341 09-25-12 2339 2012-09-25 1941 2012/09/25 1528 September 25, 2012 1238 09/25/12 1133  GLUCAP 250* 269* 289* 312* 374* 135*   Hemoglobin  A1C:  Lab 09/25/12 0945  HGBA1C 6.3*    Thyroid Function Tests:  Lab 09-25-2012 0932  TSH 1.716  T4TOTAL --  FREET4 --  T3FREE --  THYROIDAB --   Coagulation:  Lab 25-Sep-2012 0945  LABPROT 15.4*  INR 1.24    Urinalysis:  Lab 09/25/12 2327  COLORURINE RED*  LABSPEC 1.026  PHURINE 5.0  GLUCOSEU >1000*  HGBUR LARGE*  BILIRUBINUR NEGATIVE  KETONESUR 40*  PROTEINUR 100*  UROBILINOGEN 0.2  NITRITE NEGATIVE  LEUKOCYTESUR SMALL*      Lab 09/07/12 0320 09-25-12 0945 2012-09-25 0825  LATICACIDVEN -- -- 5.6*  PROCALCITON 3.45 0.37 --    Assessment and Plan: PULMONARY  ASSESSMENT:  Acute respiratory failure  Pulmonary infiltrate  Resp alkalosis  PLAN:  SBT today to hopefully extubate Repeat ABG on SBT corrected well.  Cxray in am  Antibiotics and cultures as per ID section   CARDIOVASCULAR  ASSESSMENT:  Complete Heart block s/p temporal wire placement  Hypotension  Elevated Trop > 1 > 5 > 20  Hx of CAD  Hx HTN  Cortisol level 26 Lactic acid 5.6 PCT 03>3.4 >  CVP today 10  PLAN:  Dopamine d/c 1/16  Titrate off Levo if possible for MAP of 65  ECG   Continue to  cycle Troponin  Heparin drip  Asa  D/c Solu cortef when no longer hypotensive (cortisol level of 26 however). No beta blocker  Per Cardiology for possible permanent pacer   RENAL  ASSESSMENT:  CKD unclear stage  Acidosis ph of 7 > now Resp alkalosis > Vent changes made  Cath placed by Urology > Good urine output   PLAN:  Vent changes made Monitor renal function and urine output   GASTROINTESTINAL  ASSESSMENT:  No acute issues  PLAN:  Swallow evaluation post extubation.  HEMATOLOGIC  ASSESSMENT:  Thrombocytopenia  - 130 > 108 Anemia 9.3 > 8.5  Currently on Heparin drip  Coags on admission WNL   PLAN:  F/u CBC Transfuse if < 8   INFECTIOUS  ASSESSMENT:  Pulmonary infiltrates - Aspiration ?  Hypotension  Septic shock - unlikely  Temp of 101.3 today WBC 10.8<11.6 PLAN:    Continue Vanc, levo and Zosyn, Tamiflu for now and follow cultures : Blood cx, Urine cx, Resp culture, Respiratory viral panel, Urine leg, Urine strep   ENDOCRINE  DM , Hgb A1c 6.3  TSH 1.17  Cortisol level  26.7  ASSESSMENT:  Stress steroids  PLAN:  SSI  D/C steroids   NEUROLOGIC  ASSESSMENT:  CT and CT neck negative from outside hospital  Awake and Alert  PLAN:  Sedated with fentanyl and versed prn  Limit benoz    Best practices / Disposition: -->ICU status  under PCCM -->DNR -->Heparin gtt for DVT Px -->Protonix for GI Px -->ventilator bundle -->diet    ILLATH,JASEELA 09/07/2012, 7:40 AM  Patient weaning very well, remains on low dose pressors but anticipate that will come off once extubated.  Also increased secretions that appears purulent.  Will extubate and order swallow evaluation with pulmonary hygienes and keep under monitor.  If fails extubation in the next few hours will reintubate but after today will honor DNR.  The patient is critically ill with multiple organ systems failure and requires high complexity decision making for assessment and support, frequent evaluation and titration of therapies, application of advanced monitoring technologies and extensive interpretation of multiple databases. Critical Care Time devoted to patient care services described in this note is   35 minutes.  Patient seen and examined, agree with above note.  I dictated the care and orders written for this patient under my direction.  Koren Bound, M.D. [phone]

## 2012-09-07 NOTE — Progress Notes (Signed)
ANTICOAGULATION CONSULT NOTE - Follow Up Consult  Pharmacy Consult for Heparin Indication: chest pain/ACS  No Known Allergies  Patient Measurements: Height: 5\' 9"  (175.3 cm) Weight: 190 lb 14.7 oz (86.6 kg) IBW/kg (Calculated) : 70.7   Vital Signs: Temp: 98 F (36.7 C) (01/17 2000) Temp src: Oral (01/17 2000) BP: 94/59 mmHg (01/17 2235) Pulse Rate: 99  (01/17 2235)  Labs:  Basename 09/07/12 2217 09/07/12 2200 09/07/12 1910 09/07/12 1330 09/07/12 1200 09/07/12 0811 09/07/12 0445 09/07/12 0320 09/14/2012 1230 09/05/2012 0945  HGB 9.2* -- -- -- -- -- 8.5* -- -- --  HCT 26.6* -- -- -- -- -- 23.6* -- -- 27.9*  PLT 116* -- -- -- -- -- 108* -- -- 130*  APTT -- -- -- -- -- -- -- -- -- 24  LABPROT -- -- -- -- -- -- -- -- -- 15.4*  INR -- -- -- -- -- -- -- -- -- 1.24  HEPARINUNFRC -- 0.62 -- -- 0.68 -- -- 0.72* -- --  CREATININE -- -- -- -- -- -- -- 1.28 1.42* 1.51*  CKTOTAL -- -- -- -- -- -- -- -- -- --  CKMB -- -- -- -- -- -- -- -- -- --  TROPONINI -- -- 8.94* 10.70* -- 15.98* -- -- -- --    Estimated Creatinine Clearance: 47.7 ml/min (by C-G formula based on Cr of 1.28).   Medical History: Past Medical History  Diagnosis Date  . HTN (hypertension)   . CKD (chronic kidney disease)   . DM (diabetes mellitus)   . CAD (coronary artery disease)     Medications:  Scheduled:     . antiseptic oral rinse  15 mL Mouth Rinse QID  . aspirin  325 mg Per Tube Daily  . chlorhexidine  15 mL Mouth Rinse BID  . hydrocortisone sodium succinate  50 mg Intravenous Q6H  . insulin aspart  0-9 Units Subcutaneous Q4H  . levofloxacin (LEVAQUIN) IV  750 mg Intravenous Q48H  . oseltamivir  75 mg Oral BID  . pantoprazole (PROTONIX) IV  40 mg Intravenous Q24H  . piperacillin-tazobactam  3.375 g Intravenous Q8H  . pneumococcal 23 valent vaccine  0.5 mL Intramuscular Tomorrow-1000  . vancomycin  1,250 mg Intravenous Q24H    Assessment: 79 year old male admitted with complete heart block and  hypotension, now on IV heparin for elevated troponins, ruled in for MI. Heparin level (0.62) is at goal.  No noted bleeding.   Goal of Therapy:  Heparin level 0.3-0.7 units/ml Monitor platelets by anticoagulation protocol: Yes   Plan:  1. Cont IV heparin to 900 units/hr. 2. F/u am labs.  Thank you,  Talbert Cage, PharmD 09/07/2012 10:56 PM

## 2012-09-07 NOTE — Progress Notes (Signed)
PT Cancellation Note  Patient Details Name: William Collins MRN: [id number] DOB: 16-Oct-1926   Cancelled Treatment:    Reason Eval/Treat Not Completed: Medical issues which prohibited therapy (pt not yet 4hrs post extubation)   Delaney Meigs, PT [phone]

## 2012-09-07 NOTE — Progress Notes (Signed)
Patient ID: William Collins, male   DOB: August 02, 1927, 79 y.o.   MRN: [id number] Subjective:  Awake and alert, initially intubated earlier this morning but now extubated and maintaining sats in low mid 90's. No anginal symptoms.   Objective:  Vital Signs in the last 24 hours: Temp:  [98.1 F (36.7 C)-101.8 F (38.8 C)] 99.9 F (37.7 C) (01/17 1135) Pulse Rate:  [98-100] 99  (01/17 1300) Resp:  [20-35] 23  (01/17 1300) BP: (88-137)/(56-71) 105/57 mmHg (01/17 1240) SpO2:  [95 %-100 %] 95 % (01/17 1300) Arterial Line BP: (97-135)/(52-71) 102/56 mmHg (01/17 1300) FiO2 (%):  [30 %] 30 % (01/17 1240) Weight:  [190 lb 14.7 oz (86.6 kg)] 190 lb 14.7 oz (86.6 kg) (01/17 0447)  Intake/Output from previous day: 01/16 0701 - 01/17 0700 In: 3190.8 [I.V.:2470.8; NG/GT:60; IV Piggyback:660] Out: 2120 [Urine:2120] Intake/Output from this shift: Total I/O In: 1162 [I.V.:912; IV Piggyback:250] Out: 250 [Urine:250]  Physical Exam: Elderly appearing NAD HEENT: Unremarkable Neck:  8 cm JVD, no thyromegally Lungs:  Clear with rales 1/3 up bilaterally. HEART:  Regular rate rhythm, no murmurs, no rubs, no clicks Abd:  Flat, positive bowel sounds, no organomegally, no rebound, no guarding Ext:  2 plus pulses, no edema, no cyanosis, no clubbing Skin:  No rashes no nodules Neuro:  CN II through XII intact, motor grossly intact  Lab Results:  Basename 09/07/12 0445 09/01/2012 0945  WBC 10.8* 11.6*  HGB 8.5* 9.3*  PLT 108* 130*    Basename 09/07/12 0320 08/25/2012 1230  NA 135 132*  K 4.2 5.6*  CL 107 106  CO2 14* 8*  GLUCOSE 289* 413*  BUN 23 30*  CREATININE 1.28 1.42*    Basename 09/07/12 0811 09/14/2012 2315  TROPONINI 15.98* >20.00*   Hepatic Function Panel  Basename 09/13/2012 0945  PROT 5.4*  ALBUMIN 3.1*  AST 24  ALT 16  ALKPHOS 34*  BILITOT 0.3  BILIDIR --  IBILI --   No results found for this basename: CHOL in the last 72 hours No results found for this basename: PROTIME in  the last 72 hours  Imaging: Dg Chest Port 1 View  09/07/2012  *RADIOLOGY REPORT*  Clinical Data: Post retraction of endotracheal tube  PORTABLE CHEST - 1 VIEW  Comparison: Portable exam 0847 hours compared to 0527 hours  Findings: Tip of endotracheal tube is 5 mm above carina. Recommend withdrawal additional 2 cm. Nasogastric tube extends into stomach. Right jugular pacing lead (confirmed with patient's nurse) projects over right ventricle. External pacing leads and EKG leads present.  Stable mild enlargement of cardiac silhouette. Mediastinal contours normal. Persistent bronchitic changes and bibasilar opacities question atelectasis versus infiltrate. No pneumothorax.  IMPRESSION: Tip of endotracheal tube 5 mm from carina; recommend withdrawal additional 2 cm. Persistent bronchitic changes and bibasilar atelectasis versus consolidation.  Critical Value/emergent results were called by telephone at the time of interpretation on 09/07/2012 at 0910 hours to [place] on 2900, who verbally acknowledged these results.   Original Report Authenticated By: Ulyses Southward, M.D.    Dg Chest Port 1 View  09/07/2012  *RADIOLOGY REPORT*  Clinical Data: Evaluate endotracheal tube  PORTABLE CHEST - 1 VIEW  Comparison: 09/05/2012  Findings: Are grossly unchanged borderline enlarged cardiac silhouette and mediastinal contours given persistently reduced lung volumes and patient rotation to the left.  Interval advancement of endotracheal tube with tip now projecting over the origin of the right mainstem bronchus.  Right jugular approach central venous catheter tip was excluded  from view.  Interval placement of enteric tube with tip and side port turning below left hemidiaphragm. External pacing device overlies the left heart apex.  Pulmonary vasculature remains indistinct.  Unchanged small bilateral effusions and bibasilar heterogeneous opacities.  No new focal airspace opacity.  Unchanged bones.  IMPRESSION: 1.  Interval  advancement of endotracheal tube with tip now projecting over the origin of right mainstem bronchus.  Retraction approximately 3 cm is recommended.  No pneumothorax. 2.  Tip of right jugular approach central venous catheter is excluded from view. 3. Interval placement of enteric tube with tip and side port projecting below left hemidiaphragm. 4.  Grossly unchanged findings of pulmonary edema and bibasilar opacities, atelectasis versus infiltrate.  Above findings discussed with [place]app[place], RN at 07:24.   Original Report Authenticated By: Tacey Ruiz, MD    Dg Chest Port 1 View  08/23/2012  **ADDENDUM** CREATED: 08/25/2012 10:25:08  Discussed with referring physician.  The patient's right IJ line reflects a transvenous pacer.  As such, it is in satisfactory position, and no adjustment is necessary.  **END ADDENDUM** SIGNED BY: Charline Bills, M.D.   08/22/2012  *RADIOLOGY REPORT*  Clinical Data: New central line, bibasilar infiltrates  PORTABLE CHEST - 1 VIEW  Comparison: None.  Findings: Endotracheal tube terminates 3 cm above the carina.  Patchy right lower lobe opacity, possibly a combination of atelectasis and pleural effusion.  The heart is normal in size.  Defibrillator pad overlies the left hemithorax.  Suspected right IJ venous catheter terminates in the right ventricle.  Withdrawal approximately 12 cm is suggested.  Additional catheter tubing overlying the right clavicle is presumed to be external to the patient.  IMPRESSION: Endotracheal tube terminates 3 cm above the carina.  Suspected right IJ venous catheter terminates in the right ventricle.  Withdrawal approximately 12 cm is suggested.  Patchy right lower lobe opacity, possibly a combination of atelectasis and pleural effusion.  These results will be called to the ordering clinician or representative by the Radiologist Assistant, and communication documented in the PACS Dashboard.   Original Report Authenticated By: Charline Bills, M.D.      Cardiac Studies: Tele - ventricular pacing Assessment/Plan:  1. CHB - s/p temporary TV pacemaker via the right IJ. His PPM is working normally. Would keep in over weekend and make decision about need for ongoing PPM based on how he does this weekend. He has ruled in for MI. If inferior MI, he might not need PPM long term. If no improvement in conduction by Monday, would consider PPM. Hopefully no capture problems this weekend. 2. NSTEMI - despite initially normal troponin, he is ruling in. If his clinical status continues to improve, he will need left heart cath next week. Currently no anginal symptoms. 3. Renal insufficiency - appears due to ATN as renal function much improved. Urine output good  LOS: 1 day    Buel Ream.D. 09/07/2012, 2:51 PM

## 2012-09-07 NOTE — Progress Notes (Signed)
ANTICOAGULATION CONSULT NOTE - Follow Up Consult  Pharmacy Consult for Heparin Indication: chest pain/ACS  No Known Allergies  Patient Measurements: Height: 5\' 9"  (175.3 cm) Weight: 190 lb 14.7 oz (86.6 kg) IBW/kg (Calculated) : 70.7   Vital Signs: Temp: 99.9 F (37.7 C) (01/17 1135) Temp src: Oral (01/17 1135) BP: 105/57 mmHg (01/17 1240) Pulse Rate: 99  (01/17 1300)  Labs:  Basename 09/07/12 1200 09/07/12 0811 09/07/12 0445 09/07/12 0320 09/19/2012 2315 09/19/12 1331 Sep 19, 2012 1230 September 19, 2012 0945  HGB -- -- 8.5* -- -- -- -- 9.3*  HCT -- -- 23.6* -- -- -- -- 27.9*  PLT -- -- 108* -- -- -- -- 130*  APTT -- -- -- -- -- -- -- 24  LABPROT -- -- -- -- -- -- -- 15.4*  INR -- -- -- -- -- -- -- 1.24  HEPARINUNFRC 0.68 -- -- 0.72* -- -- -- --  CREATININE -- -- -- 1.28 -- -- 1.42* 1.51*  CKTOTAL -- -- -- -- -- -- -- --  CKMB -- -- -- -- -- -- -- --  TROPONINI -- 15.98* -- -- >20.00* 5.12* -- --    Estimated Creatinine Clearance: 47.7 ml/min (by C-G formula based on Cr of 1.28).   Medical History: Past Medical History  Diagnosis Date  . HTN (hypertension)   . CKD (chronic kidney disease)   . DM (diabetes mellitus)   . CAD (coronary artery disease)     Medications:  Scheduled:     . antiseptic oral rinse  15 mL Mouth Rinse QID  . aspirin  325 mg Per Tube Daily  . [COMPLETED] calcium chloride  IV  1 g Intravenous Once  . chlorhexidine  15 mL Mouth Rinse BID  . [COMPLETED] heparin  4,000 Units Intravenous Once  . hydrocortisone sodium succinate  50 mg Intravenous Q6H  . insulin aspart  0-9 Units Subcutaneous Q4H  . levofloxacin (LEVAQUIN) IV  750 mg Intravenous Q48H  . [EXPIRED] lidocaine (cardiac) 100 mg/91ml      . oseltamivir  75 mg Oral BID  . pantoprazole (PROTONIX) IV  40 mg Intravenous Q24H  . piperacillin-tazobactam  3.375 g Intravenous Q8H  . [EXPIRED] rocuronium      . [COMPLETED] sodium polystyrene  30 g Per Tube Once  . [EXPIRED] succinylcholine      .  vancomycin  1,250 mg Intravenous Q24H    Assessment: 79 year old male admitted with complete heart block and hypotension, now on IV heparin for elevated troponins. Heparin level (0.68) is at goal but slightly on the higher end at 950 units/hr.  Noted slight decrease in cbc as well at plts at 108. No noted bleeding.   Goal of Therapy:  Heparin level 0.3-0.7 units/ml Monitor platelets by anticoagulation protocol: Yes   Plan:  1. Decrease IV heparin to 900 units/hr. 2. Heparin level in 8 hours.   Thank you,  Brett Fairy, PharmD, BCPS 09/07/2012 1:35 PM

## 2012-09-07 NOTE — Progress Notes (Signed)
ANTICOAGULATION CONSULT NOTE - Follow Up Consult  Pharmacy Consult for Heparin Indication: chest pain/ACS  No Known Allergies  Patient Measurements: Height: 5\' 9"  (175.3 cm) Weight: 183 lb 13.8 oz (83.4 kg) IBW/kg (Calculated) : 70.7   Vital Signs: Temp: 98.8 F (37.1 C) (01/17 0100) Temp src: Oral (01/17 0100) BP: 137/71 mmHg (01/16 1639) Pulse Rate: 99  (01/16 2300)  Labs:  Basename 09/07/12 0320 2012-10-02 2315 10/02/2012 1331 10-02-12 1230 October 02, 2012 0945  HGB -- -- -- -- 9.3*  HCT -- -- -- -- 27.9*  PLT -- -- -- -- 130*  APTT -- -- -- -- 24  LABPROT -- -- -- -- 15.4*  INR -- -- -- -- 1.24  HEPARINUNFRC 0.72* -- -- -- --  CREATININE -- -- -- 1.42* 1.51*  CKTOTAL -- -- -- -- --  CKMB -- -- -- -- --  TROPONINI -- >20.00* 5.12* -- 1.01*    Estimated Creatinine Clearance: 39.4 ml/min (by C-G formula based on Cr of 1.42).   Medical History: Past Medical History  Diagnosis Date  . HTN (hypertension)   . CKD (chronic kidney disease)   . DM (diabetes mellitus)   . CAD (coronary artery disease)     Medications:  Scheduled:     . antiseptic oral rinse  15 mL Mouth Rinse QID  . aspirin  325 mg Per Tube Daily  . [COMPLETED] calcium chloride  IV  1 g Intravenous Once  . chlorhexidine  15 mL Mouth Rinse BID  . [COMPLETED] dextrose  1 ampule Intravenous Once  . [COMPLETED] etomidate      . [COMPLETED] fentaNYL      . [COMPLETED] heparin  4,000 Units Intravenous Once  . hydrocortisone sodium succinate  50 mg Intravenous Q6H  . insulin aspart  0-9 Units Subcutaneous Q4H  . [COMPLETED] insulin aspart  5 Units Intravenous Once  . levofloxacin (LEVAQUIN) IV  750 mg Intravenous Q48H  . [EXPIRED] lidocaine (cardiac) 100 mg/66ml      . [COMPLETED] lidocaine   Topical Once  . [COMPLETED] midazolam      . oseltamivir  75 mg Oral BID  . pantoprazole (PROTONIX) IV  40 mg Intravenous Q24H  . piperacillin-tazobactam  3.375 g Intravenous Q8H  . [EXPIRED] rocuronium      .  [COMPLETED] sodium chloride  500 mL Intravenous Once  . [COMPLETED] sodium polystyrene  30 g Per Tube Once  . [EXPIRED] succinylcholine      . [COMPLETED] vancomycin  1,250 mg Intravenous Once  . vancomycin  1,250 mg Intravenous Q24H  . [DISCONTINUED] aspirin  300 mg Rectal Daily  . [DISCONTINUED] insulin aspart  1-3 Units Subcutaneous Q4H  . [DISCONTINUED] levofloxacin (LEVAQUIN) IV  750 mg Intravenous Q24H  . [DISCONTINUED] oseltamivir  75 mg Oral BID  . [DISCONTINUED] piperacillin-tazobactam (ZOSYN)  IV  3.375 g Intravenous Q8H  . [DISCONTINUED] vancomycin  750 mg Intravenous Q12H    Assessment: 79 year old male admitted with complete heart block and hypotension, now on IV heparin for elevated troponins. Heparin level (0.72) is above-goal on 1000 units/hr.   Goal of Therapy:  Heparin level 0.3-0.7 units/ml Monitor platelets by anticoagulation protocol: Yes   Plan:  1. Decrease IV heparin to 950 units/hr. 2. Heparin level in 8 hours.   Lorre Munroe, PharmD 09/07/2012, 3:54 AM

## 2012-09-07 NOTE — Progress Notes (Signed)
Inpatient Diabetes Program Recommendations  AACE/ADA: New Consensus Statement on Inpatient Glycemic Control (2013)  Target Ranges:  Prepandial:   less than 140 mg/dL      Peak postprandial:   less than 180 mg/dL (1-2 hours)      Critically ill patients:  140 - 180 mg/dL  Results for LIEF, PALMATIER (MRN [id number]) as of 09/07/2012 10:20  Ref. Range 09/20/2012 15:28 09/15/2012 19:41 09/05/2012 23:39 09/07/2012 03:41 09/07/2012 07:37  Glucose-Capillary Latest Range: 70-99 mg/dL 409 (H) 811 (H) 914 (H) 250 (H) 285 (H)   Inpatient Diabetes Program Recommendations Correction (SSI): Increase to MODERATE scale during steroid therapy Thank you  Piedad Climes BSN, RN,CDE Inpatient Diabetes Coordinator [phone] (team pager)

## 2012-09-07 NOTE — Progress Notes (Signed)
09/07/2012- 1350- Pt suctioned and extubated to 4lpm cannula.  RN at bedside for procedure.  Pt able to verbalize post extubation.  Sats 95% on 4lpm cannula, hr99, rr20, bp 100/55.  Will cont to monitor progress.

## 2012-09-07 NOTE — Progress Notes (Signed)
eLink Physician-Brief Progress Note Patient Name: William Collins DOB: Jan 08, 1927 MRN: [id number]  Date of Service  09/07/2012   HPI/Events of Note  Patient with resp alkalosis on vent with RR rate set at 35.  Current pH of 7.5 with pCO2 of 18   eICU Interventions  Plan: Decrease RR to 25 Recheck ABG in 2 hours   Intervention Category Intermediate Interventions: Other: (Acid/base)  DETERDING,ELIZABETH 09/07/2012, 3:46 AM

## 2012-09-07 NOTE — Progress Notes (Signed)
Labs reviewed   Magnesium replaced and bicarb discontinued

## 2012-09-07 NOTE — Progress Notes (Signed)
The patient complaining of increased shortness of breath; extubated this am; not to be re intubated; CAP/CHF NSTEMI, ARF; I/O pos 2.5 L; Bed side exam with crackles and wheezing consistent of pulmonary edema;   Plan: obtain routine labs to verify electrolytes; evaluate bicarb, CXR and ABG stat. Titrate levophed down is possible and minimize fluid intake. Continue cyclining cardiac enzymes and consider lasix if tolerated.

## 2012-09-08 ENCOUNTER — Inpatient Hospital Stay (HOSPITAL_COMMUNITY): Payer: Medicare Other

## 2012-09-08 LAB — CBC
HCT: 24.9 % — ABNORMAL LOW (ref 39.0–52.0)
MCHC: 34.9 g/dL (ref 30.0–36.0)
MCV: 98.8 fL (ref 78.0–100.0)
Platelets: 92 10*3/uL — ABNORMAL LOW (ref 150–400)
RDW: 13.2 % (ref 11.5–15.5)
WBC: 11.9 10*3/uL — ABNORMAL HIGH (ref 4.0–10.5)

## 2012-09-08 LAB — URINE CULTURE

## 2012-09-08 LAB — TROPONIN I
Troponin I: 10.57 ng/mL (ref <0.30)
Troponin I: 11.41 ng/mL (ref <0.30)

## 2012-09-08 LAB — STREP PNEUMONIAE URINARY ANTIGEN: Strep Pneumo Urinary Antigen: NEGATIVE

## 2012-09-08 LAB — CULTURE, RESPIRATORY W GRAM STAIN: Special Requests: NORMAL

## 2012-09-08 LAB — POCT I-STAT 3, ART BLOOD GAS (G3+)
Bicarbonate: 19.7 mEq/L — ABNORMAL LOW (ref 20.0–24.0)
Patient temperature: 98.6
TCO2: 21 mmol/L (ref 0–100)
pH, Arterial: 7.351 (ref 7.350–7.450)
pO2, Arterial: 64 mmHg — ABNORMAL LOW (ref 80.0–100.0)

## 2012-09-08 LAB — GLUCOSE, CAPILLARY
Glucose-Capillary: 275 mg/dL — ABNORMAL HIGH (ref 70–99)
Glucose-Capillary: 212 mg/dL — ABNORMAL HIGH (ref 70–99)
Glucose-Capillary: 316 mg/dL — ABNORMAL HIGH (ref 70–99)
Glucose-Capillary: 278 mg/dL — ABNORMAL HIGH (ref 70–99)
Glucose-Capillary: 297 mg/dL — ABNORMAL HIGH (ref 70–99)

## 2012-09-08 MED ORDER — POTASSIUM CHLORIDE CRYS ER 20 MEQ PO TBCR
20.0000 meq | EXTENDED_RELEASE_TABLET | Freq: Every day | ORAL | Status: DC
  Administered 2012-09-08: 20 meq via ORAL
  Filled 2012-09-08 (×2): qty 1

## 2012-09-08 MED ORDER — OSELTAMIVIR PHOSPHATE 75 MG PO CAPS
75.0000 mg | ORAL_CAPSULE | Freq: Two times a day (BID) | ORAL | Status: DC
  Administered 2012-09-08 (×2): 75 mg via ORAL
  Filled 2012-09-08 (×4): qty 1

## 2012-09-08 MED ORDER — FUROSEMIDE 10 MG/ML IJ SOLN: 40.0000 mg | Freq: Once | INTRAMUSCULAR | Status: DC

## 2012-09-08 MED ORDER — PIPERACILLIN-TAZOBACTAM 3.375 G IVPB
3.3750 g | Freq: Three times a day (TID) | INTRAVENOUS | Status: DC
  Filled 2012-09-08 (×2): qty 50

## 2012-09-08 MED ORDER — LIDOCAINE HCL 2 % EX GEL
Freq: Three times a day (TID) | CUTANEOUS | Status: DC | PRN
  Administered 2012-09-08: 1 via URETHRAL
  Filled 2012-09-08: qty 5

## 2012-09-08 MED ORDER — FUROSEMIDE 10 MG/ML IJ SOLN
20.0000 mg | Freq: Once | INTRAMUSCULAR | Status: AC
  Administered 2012-09-08: 20 mg via INTRAVENOUS

## 2012-09-08 MED ORDER — FUROSEMIDE 10 MG/ML IJ SOLN
INTRAMUSCULAR | Status: AC
  Administered 2012-09-08: 20 mg via INTRAVENOUS
  Filled 2012-09-08: qty 4

## 2012-09-08 MED ORDER — HYDROCORTISONE SOD SUCCINATE 100 MG IJ SOLR
50.0000 mg | Freq: Every day | INTRAMUSCULAR | Status: DC
  Filled 2012-09-08: qty 1

## 2012-09-08 MED ORDER — POTASSIUM CHLORIDE 20 MEQ/15ML (10%) PO LIQD: 20.0000 meq | Freq: Every day | ORAL | Status: DC

## 2012-09-08 MED ORDER — LORAZEPAM 2 MG/ML IJ SOLN
0.5000 mg | Freq: Four times a day (QID) | INTRAMUSCULAR | Status: DC | PRN
  Administered 2012-09-08: 0.5 mg via INTRAVENOUS
  Filled 2012-09-08: qty 1

## 2012-09-08 MED ORDER — RESOURCE THICKENUP CLEAR PO POWD
ORAL | Status: DC | PRN
Start: 2012-09-08 — End: 2012-09-09
  Filled 2012-09-08: qty 125

## 2012-09-08 MED ORDER — FENTANYL CITRATE 0.05 MG/ML IJ SOLN: 12.5000 ug | INTRAMUSCULAR | Status: DC | PRN

## 2012-09-08 MED ORDER — SODIUM CHLORIDE 0.9 % IV SOLN: INTRAVENOUS | Status: DC

## 2012-09-08 MED ORDER — FUROSEMIDE 10 MG/ML IJ SOLN
40.0000 mg | Freq: Once | INTRAMUSCULAR | Status: AC
  Administered 2012-09-08: 40 mg via INTRAVENOUS
  Filled 2012-09-08: qty 4

## 2012-09-08 MED ORDER — ALPRAZOLAM 0.5 MG PO TABS
0.5000 mg | ORAL_TABLET | Freq: Once | ORAL | Status: AC
  Administered 2012-09-08: 0.5 mg via ORAL
  Filled 2012-09-08: qty 1

## 2012-09-08 MED ORDER — LIDOCAINE HCL 4 % EX SOLN
Freq: Three times a day (TID) | CUTANEOUS | Status: DC | PRN
  Filled 2012-09-08: qty 50

## 2012-09-08 NOTE — Progress Notes (Signed)
Patient has increased confusion and is very anxious. He is yelling out and is requesting to go to bed and wants his pajamas on. Patient is in bed and is just very confused and agitated. Physician paged and one time order received.

## 2012-09-08 NOTE — Progress Notes (Signed)
The patient remains on Levophed, received lasix today and urine output responded to diureses. Despite diuresis I do not see improvement in his respiratory status; Remains tachypnic.  He was confused during the day and received Ativan for agitation.  I would recommend palliative care consult in am.

## 2012-09-08 NOTE — Progress Notes (Addendum)
Casey Burkitt, a relative (by marriage) of patient, called stating correct password requesting information. Spoke initially with Programmer, applications and then I spoke with her. After being updated by [place] Nickel she was insistent about receiving more information. Patsy was inquiring about visitors today and patient asked who was on the phone. When Tandra told him Casey Burkitt was on the phone he then stated he didn't want her knowing anything and it was "none of her business". When questioned by another nurse he strongly stated he did not want her to know anything. Patsy continued to insist on receiving information and I took over the phone call. I readvised her that the patient did not want her to have anymore information and that she would need to ask the nephew for updates. She requested to speak to the patient but I declined due to current medical needs (patient was unable due to SOB at the time). She continued to demand to release information to her because "I take care of his wife so I need to know". We I again told her no she stated "Then we are going to put her in a home" and after a total of 20 min between Tandra and I talking to her I had to end the conversation.

## 2012-09-08 NOTE — Progress Notes (Signed)
Name: William Collins MRN: [id number] DOB: 07-Oct-1926 LOS: 2  PCCM  PROGRESS NOTE  History of Present Illness:  79 year old male with PMH significant for CAD, DM, HTN , CKD stage IV, who was found down by wife on the floor at home with fecal material and confusion. EMS gave IV Atropin for HR in 30s . Hypotensive and complete Heart block . IV dopamine started and transcutaneous pacemake placed. [facility] transported by [facility]. On arrival patient developed acute respiratory failure and was intubated. PCCM was consulted.   LINES / TUBES:  ETT 1/16 >> 1/17 Left femoral (placed to slavage left for pacer perm)1/16>>  Right femoral A line 1/16 >> 1/17 Right pacer line 1/16>>>   CULTURES:  Blood cx 1/16>> NTD Resp cx 1/16 >> Mixed flora>e. Coli  Urine cx 1/16 >> NTD Resp viral panel 1/16 >>  Urine leg 1/16 >> Neg Urine strep 1/16 >> negative  Influenza nasal  1/16 >>swab negative   ANTIBIOTICS:  Vanc 1/16 >> 1/18 Zosyn 1/16 >> 1/18 Tamiflu 1/16 >>  Levaquin 1/15>>>  SIGNIFICANT EVENTS:  CT head and Neck 1/16 from  [facility] : negative for acute process 1/16 Arrived to floor with respiratory distress and Hypotensive on Levo and Dopamine > intubated  1/16 After discussion  with patient's wife : DNR status  1/16 2 D echo : Left ventricle: The cavity size was normal. There appeared to be septal-lateral dyssynchrony. The mid to apical inferior wall appears akinetic, mid to apical septum severely hypokinetic, apical wall severely hypokinetic, lateral wall kinetic. Other walls poorly visualized.Systolic function was severely reduced. The estimated ejection fraction was in the range of 25% to 30%.    Overnight Events:  - extubated 1/17 , remains in + bal, more alert   Vital Signs: Temp:  [97.3 F (36.3 C)-98 F (36.7 C)] 97.6 F (36.4 C) (01/18 0800) Pulse Rate:  [96-101] 99  (01/18 1000) Resp:  [23-32] 26  (01/18 1100) BP: (88-106)/(49-80)  95/60 mmHg (01/18 1100) SpO2:  [89 %-98 %] 97 % (01/18 1000) Arterial Line BP: (93-136)/(49-75) 114/60 mmHg (01/18 1100) FiO2 (%):  [30 %] 30 % (01/17 1400) Weight:  [85.7 kg (188 lb 15 oz)] 85.7 kg (188 lb 15 oz) (01/18 0500) I/O last 3 completed shifts: In: 5206.1 [I.V.:4594.1; NG/GT:60; IV Piggyback:552] Out: 3600 [Urine:3600]  Physical Examination: General: Awake  Neuro: Awake and Alert  HEENT:dry mucosa  Cardiovascular: Paced at 100 , no murmurs noted  Lungs: Rhonchi present, coarse crackles , no wheezing  Abdomen: Soft, BS present EXT : tr edema   Ventilator settings: Vent Mode:  [-] PSV;CPAP FiO2 (%):  [30 %] 30 % PEEP:  [5 cmH20] 5 cmH20 Pressure Support:  [5 cmH20] 5 cmH20  Basename 09/08/12 0439 09/07/12 2223 09/07/12 1031  PHART 7.351 7.352 7.378  PO2ART 64.0* 68.0* 73.0*  TCO2 21 20 19   HCO3 19.7* 19.4* 18.0*   Labs and Imaging:   Basic Metabolic Panel:  Lab 09/08/12 4098 09/07/12 2217 09/07/12 0320  NA -- 134* 135  K -- 3.6 4.2  CL -- 101 107  CO2 -- 19 14*  GLUCOSE -- 335* 289*  BUN -- 19 23  CREATININE -- 1.24 1.28  CALCIUM -- 7.4* 7.5*  MG -- 1.1* --  PHOS 3.2 -- --   Liver Function Tests:  Lab 09/04/2012 0945  AST 24  ALT 16  ALKPHOS 34*  BILITOT 0.3  PROT 5.4*  ALBUMIN 3.1*  CBC:  Lab 09/08/12 0450 09/07/12 2217 09/07/12 0445 09/21/2012 0945  WBC 11.9* 15.0* -- --  NEUTROABS -- -- 9.1* 10.2*  HGB 8.7* 9.2* -- --  HCT 24.9* 26.6* -- --  MCV 98.8 99.3 -- --  PLT 92* 116* -- --   Cardiac Enzymes:  Lab 09/08/12 0850 09/08/12 0126 09/07/12 1910  CKTOTAL -- -- --  CKMB -- -- --  CKMBINDEX -- -- --  TROPONINI 11.41* 10.57* 8.94*   BNP:  Lab 08/23/2012 0945  PROBNP 2207.0*    CBG:  Lab 09/08/12 0810 09/08/12 0450 09/08/12 0001 09/07/12 2019 09/07/12 1611 09/07/12 1134  GLUCAP 212* 250* 275* 257* 253* 267*   Hemoglobin A1C:  Lab 08/27/2012 0945  HGBA1C 6.3*    Thyroid Function Tests:  Lab 08/25/2012 0932  TSH 1.716    T4TOTAL --  FREET4 --  T3FREE --  THYROIDAB --   Coagulation:  Lab 09/21/2012 0945  LABPROT 15.4*  INR 1.24    Urinalysis:  Lab 09/08/2012 2327  COLORURINE RED*  LABSPEC 1.026  PHURINE 5.0  GLUCOSEU >1000*  HGBUR LARGE*  BILIRUBINUR NEGATIVE  KETONESUR 40*  PROTEINUR 100*  UROBILINOGEN 0.2  NITRITE NEGATIVE  LEUKOCYTESUR SMALL*      Lab 09/08/12 0450 09/07/12 0320 09/13/2012 0945 08/23/2012 0825  LATICACIDVEN -- -- -- 5.6*  PROCALCITON 1.70 3.45 0.37 --   CXR 1/18 Persistently reduced lung volumes with findings suggestive of  pulmonary edema, trace bilateral effusions and perihilar/ bibasilar  opacities, atelectasis versus infiltrate.   Assessment and Plan: PULMONARY  ASSESSMENT:  Acute respiratory failure -extubated 1/17  Pulmonary infiltrate -appears to be in fluid overload w/ + bal  Resp alkalosis  Aspiration - sputum cx + e.coli   PLAN:  No reintubation Diuresis per cards   CARDIOVASCULAR  ASSESSMENT:  Complete Heart block s/p temporal wire placement  Hypotension -weaning off pressors  Elevated Trop > 1 > 5 > 20  Hx of CAD  Hx HTN  Cortisol level 26 Lactic acid 5.6 PCT 03>3.4 >  CVP today 10  PLAN:  Dopamine d/c 1/16  Titrate off Levo if possible for MAP of 65   Heparin drip  Asa  D/c Solu cortef when no longer hypotensive (cortisol level of 26 however). No beta blocker  Per Cardiology for possible permanent pacer  Diuresis for neg bal as tolerated per cards   RENAL  ASSESSMENT:  CKD unclear stage  Acidosis ph of 7 > now Resp alkalosis > Vent changes made  Cath placed by Urology > Good urine output   PLAN:   Monitor renal function and urine output   GASTROINTESTINAL  ASSESSMENT:  Swallow eval 1/17   PLAN:  Diet per ST   HEMATOLOGIC  ASSESSMENT:  Thrombocytopenia  - 130 > 108 Anemia 9.3 > 8.5  Currently on Heparin drip  Coags on admission WNL   PLAN:  F/u CBC Transfuse if < 8   INFECTIOUS  ASSESSMENT:  Pulmonary  infiltrates - Aspiration ? -sp + e.coli  Hypotension  Septic shock -  PLAN:  dc Vanc and Zosyn,ct levaquin & Tamiflu for now    ENDOCRINE  DM , Hgb A1c 6.3  TSH 1.17  Cortisol level  26.7  ASSESSMENT:  Stress steroids  PLAN:  SSI      NEUROLOGIC  ASSESSMENT:  CT and CT neck negative from outside hospital   PLAN: Fent prn     Best practices / Disposition:  -->DNR -->Heparin gtt for DVT Px -->Protonix for GI  Px   PARRETT,TAMMY NP -C  09/08/2012, 11:37 AM  Care during the described time interval was provided by me and/or other providers on the critical care team.  I have reviewed this patient's available data, including medical history, events of note, physical examination and test results as part of my evaluation  CC time x  40 m  Shereece Wellborn V.

## 2012-09-08 NOTE — Progress Notes (Addendum)
Pt complaining about foley cath hurting and causing his lower stomach to hurt too. Bladder scanned pt and got a residual of 0ml. Urine color in drainage bag lighter than at the beginning of shift, still draining about the same amt of urine it has been draining at this time. Adjusted the leg strap to try and help ease pain. Flushed foley cath to also check placement, all 30cc of water drained back in drainage bag.

## 2012-09-08 NOTE — Progress Notes (Signed)
Patient ID: William Collins, male   DOB: 1927/01/24, 79 y.o.   MRN: [id number]   Patient Name: William Collins Date of Encounter: 09/08/2012    SUBJECTIVE  Extremely pleasant gentleman. He is dyspneic as noted by Elita Quick, RN. Respiratory rates around 28-30 and he gets even more short of breath when he tries to speak. He denies any chest pain. He has been thirsty.  CURRENT MEDS    . antiseptic oral rinse  15 mL Mouth Rinse QID  . aspirin  325 mg Per Tube Daily  . chlorhexidine  15 mL Mouth Rinse BID  . hydrocortisone sodium succinate  50 mg Intravenous Q12H  . insulin aspart  0-9 Units Subcutaneous Q4H  . levofloxacin (LEVAQUIN) IV  750 mg Intravenous Q48H  . oseltamivir  75 mg Oral BID  . pantoprazole (PROTONIX) IV  40 mg Intravenous Q24H  . piperacillin-tazobactam  3.375 g Intravenous Q8H  . pneumococcal 23 valent vaccine  0.5 mL Intramuscular Tomorrow-1000  . vancomycin  1,250 mg Intravenous Q24H    OBJECTIVE  Filed Vitals:   09/08/12 0300 09/08/12 0400 09/08/12 0500 09/08/12 0600  BP:   91/53 90/60  Pulse: 100 99 99 97  Temp:  97.3 F (36.3 C)    TempSrc:  Oral    Resp: 29 27 28 30   Height:      Weight:   188 lb 15 oz (85.7 kg)   SpO2: 95% 94% 96% 95%    Intake/Output Summary (Last 24 hours) at 09/08/12 0833 Last data filed at 09/08/12 0600  Gross per 24 hour  Intake 3091.64 ml  Output   1480 ml  Net 1611.64 ml   Filed Weights   September 07, 2012 0825 09/07/12 0447 09/08/12 0500  Weight: 183 lb 13.8 oz (83.4 kg) 190 lb 14.7 oz (86.6 kg) 188 lb 15 oz (85.7 kg)    PHYSICAL EXAM  General: Pleasant, NAD. Chronically ill, pale, dyspneic Neuro: Alert and oriented X 3. Moves all extremities spontaneously. Psych: Normal affect. HEENT:  Normal  Neck: Supple without bruits, JVD, right internal jugular pacer site looks clean Lungs:  Resp regular and unlabored, to start for expiratory rhonchi with rales particularly on the left Heart: RRR no s3, s4, or murmurs. Abdomen: Soft,  non-tender, non-distended, BS + x 4.  Extremities: No clubbing, cyanosis or edema. DP/PT/Radials 2+ and equal bilaterally. Pneumatic cuffs in place  Accessory Clinical Findings  CBC  Basename 09/08/12 0450 09/07/12 2217 09/07/12 0445 09/07/12 0945  WBC 11.9* 15.0* -- --  NEUTROABS -- -- 9.1* 10.2*  HGB 8.7* 9.2* -- --  HCT 24.9* 26.6* -- --  MCV 98.8 99.3 -- --  PLT 92* 116* -- --   Basic Metabolic Panel  Basename 09/08/12 0450 09/07/12 2217 09/07/12 0320  NA -- 134* 135  K -- 3.6 4.2  CL -- 101 107  CO2 -- 19 14*  GLUCOSE -- 335* 289*  BUN -- 19 23  CREATININE -- 1.24 1.28  CALCIUM -- 7.4* 7.5*  MG -- 1.1* --  PHOS 3.2 -- --   Liver Function Tests  Basename 09-07-12 0945  AST 24  ALT 16  ALKPHOS 34*  BILITOT 0.3  PROT 5.4*  ALBUMIN 3.1*   No results found for this basename: LIPASE:2,AMYLASE:2 in the last 72 hours Cardiac Enzymes  Basename 09/08/12 0126 09/07/12 1910 09/07/12 1330  CKTOTAL -- -- --  CKMB -- -- --  CKMBINDEX -- -- --  TROPONINI 10.57* 8.94* 10.70*   BNP No components  found with this basename: POCBNP:3 D-Dimer No results found for this basename: DDIMER:2 in the last 72 hours Hemoglobin A1C  Basename 08/24/2012 0945  HGBA1C 6.3*   Fasting Lipid Panel No results found for this basename: CHOL,HDL,LDLCALC,TRIG,CHOLHDL,LDLDIRECT in the last 72 hours Thyroid Function Tests  Basename 08/23/2012 0932  TSH 1.716  T4TOTAL --  T3FREE --  THYROIDAB --    TELE  V. paced  ECG    Radiology/Studies  Dg Chest Port 1 View  09/08/2012  *RADIOLOGY REPORT*  Clinical Data: Dyspnea, shortness of breath, cough, congestion  PORTABLE CHEST - 1 VIEW  Comparison: 09/07/2012 (multiple examinations); 08/29/2012  Findings: Grossly unchanged enlarged cardiac silhouette and mediastinal contours.  Stable positioning of support apparatus including right jugular approach pacer lead overlying expected location of the right ventricular apex. Lung volumes remain  reduced with grossly unchanged perihilar heterogeneous air space opacities. There is persistent elevation of the right hemidiaphragm. Trace bilateral effusions are suspected.  No pneumothorax.  IMPRESSION: 1.  Stable positioning of support apparatus.  No pneumothorax. 2.  Persistently reduced lung volumes with findings suggestive of pulmonary edema, trace bilateral effusions and perihilar/ bibasilar opacities, atelectasis versus infiltrate.   Original Report Authenticated By: Tacey Ruiz, MD    Dg Chest Port 1 View  09/07/2012  *RADIOLOGY REPORT*  Clinical Data: 79 year old male with shortness of breath.  PORTABLE CHEST - 1 VIEW  Comparison: 09/07/2012 and 08/23/2012 chest radiographs  Findings: An endotracheal tube and NG tube have been removed. Increasing pulmonary vascular congestion, probable interstitial edema and bilateral lower lung atelectasis noted. A right IJ pacing lead overlies the right ventricle. There is no evidence of pneumothorax. There may be small bilateral pleural effusions.  IMPRESSION: Increasing pulmonary vascular congestion, probable central edema and bibasilar atelectasis.  NG tube and endotracheal tube removal.   Original Report Authenticated By: Harmon Pier, M.D.    Dg Chest Port 1 View  09/07/2012  *RADIOLOGY REPORT*  Clinical Data: Post retraction of endotracheal tube  PORTABLE CHEST - 1 VIEW  Comparison: Portable exam 0847 hours compared to 0527 hours  Findings: Tip of endotracheal tube is 5 mm above carina. Recommend withdrawal additional 2 cm. Nasogastric tube extends into stomach. Right jugular pacing lead (confirmed with patient's nurse) projects over right ventricle. External pacing leads and EKG leads present.  Stable mild enlargement of cardiac silhouette. Mediastinal contours normal. Persistent bronchitic changes and bibasilar opacities question atelectasis versus infiltrate. No pneumothorax.  IMPRESSION: Tip of endotracheal tube 5 mm from carina; recommend withdrawal  additional 2 cm. Persistent bronchitic changes and bibasilar atelectasis versus consolidation.  Critical Value/emergent results were called by telephone at the time of interpretation on 09/07/2012 at 0910 hours to [facility] on 2900, who verbally acknowledged these results.   Original Report Authenticated By: Ulyses Southward, M.D.    Dg Chest Port 1 View  09/07/2012  *RADIOLOGY REPORT*  Clinical Data: Evaluate endotracheal tube  PORTABLE CHEST - 1 VIEW  Comparison: 09/11/2012  Findings: Are grossly unchanged borderline enlarged cardiac silhouette and mediastinal contours given persistently reduced lung volumes and patient rotation to the left.  Interval advancement of endotracheal tube with tip now projecting over the origin of the right mainstem bronchus.  Right jugular approach central venous catheter tip was excluded from view.  Interval placement of enteric tube with tip and side port turning below left hemidiaphragm. External pacing device overlies the left heart apex.  Pulmonary vasculature remains indistinct.  Unchanged small bilateral effusions and bibasilar heterogeneous opacities.  No new focal airspace opacity.  Unchanged bones.  IMPRESSION: 1.  Interval advancement of endotracheal tube with tip now projecting over the origin of right mainstem bronchus.  Retraction approximately 3 cm is recommended.  No pneumothorax. 2.  Tip of right jugular approach central venous catheter is excluded from view. 3. Interval placement of enteric tube with tip and side port projecting below left hemidiaphragm. 4.  Grossly unchanged findings of pulmonary edema and bibasilar opacities, atelectasis versus infiltrate.  Above findings discussed with Chappel, RN at 07:24.   Original Report Authenticated By: Tacey Ruiz, MD    Dg Chest Port 1 View  2012/09/15  **ADDENDUM** CREATED: 09/15/2012 10:25:08  Discussed with referring physician.  The patient's right IJ line reflects a transvenous pacer.  As such, it is in satisfactory  position, and no adjustment is necessary.  **END ADDENDUM** SIGNED BY: Charline Bills, M.D.   Sep 15, 2012  *RADIOLOGY REPORT*  Clinical Data: New central line, bibasilar infiltrates  PORTABLE CHEST - 1 VIEW  Comparison: None.  Findings: Endotracheal tube terminates 3 cm above the carina.  Patchy right lower lobe opacity, possibly a combination of atelectasis and pleural effusion.  The heart is normal in size.  Defibrillator pad overlies the left hemithorax.  Suspected right IJ venous catheter terminates in the right ventricle.  Withdrawal approximately 12 cm is suggested.  Additional catheter tubing overlying the right clavicle is presumed to be external to the patient.  IMPRESSION: Endotracheal tube terminates 3 cm above the carina.  Suspected right IJ venous catheter terminates in the right ventricle.  Withdrawal approximately 12 cm is suggested.  Patchy right lower lobe opacity, possibly a combination of atelectasis and pleural effusion.  These results will be called to the ordering clinician or representative by the Radiologist Assistant, and communication documented in the PACS Dashboard.   Original Report Authenticated By: Charline Bills, M.D.     ASSESSMENT AND PLAN  Active Problems:  HTN (hypertension)  DM (dermatomyositis)  CKD (chronic kidney disease)  CAD (coronary artery disease)  Acute respiratory failure  Hypotension  Complete heart block  Pulmonary infiltrate    Mr. Bostic is very borderline from  a respiratory standpoint. He is now a DO NOT RESUSCITATE. We'll try to diuresis today. He received a lot of fluid yesterday. Chest x-ray shows pulmonary edema and he is very dyspneic. I want to consider palliative care but we'll wait to call them until we see if he does today. I'll try to talk the family when they come today. Discussed plan with Elita Quick, RN.  Signed, Valera Castle MD

## 2012-09-08 NOTE — Progress Notes (Signed)
Patient remains confused but is now with rapid respirations at a rate of low 40's. Respiratory in to evaluate and physician notified. Will continue to assess and monitor.

## 2012-09-08 NOTE — Progress Notes (Signed)
PT Cancellation Note  Patient Details Name: William Collins MRN: [id number] DOB: 24-Nov-1926   Cancelled Treatment:    Reason Eval/Treat Not Completed: Medical issues which prohibited therapy.  Patient with 2 femoral lines per RN.  Unable to get OOB.  Will return at later date for PT evaluation.   Vena Austria 09/08/2012, 2:02 PM [phone]

## 2012-09-08 NOTE — Progress Notes (Signed)
ANTICOAGULATION AND ANTIBIOTIC CONSULT NOTE - Follow Up Consult  Pharmacy Consult for heparin, Zosyn, and Vancomycin Indication: chest pain/ACS and pneumonia  No Known Allergies  Patient Measurements: Height: 5\' 9"  (175.3 cm) Weight: 188 lb 15 oz (85.7 kg) IBW/kg (Calculated) : 70.7  Heparin Dosing Weight: 85.7kg  Vital Signs: Temp: 97.3 F (36.3 C) (01/18 0400) Temp src: Oral (01/18 0400) BP: 90/60 mmHg (01/18 0600) Pulse Rate: 97  (01/18 0600)  Labs:  Basename 09/08/12 0450 09/08/12 0126 09/07/12 2217 09/07/12 2200 09/07/12 1910 09/07/12 1330 09/07/12 1200 09/07/12 0445 09/07/12 0320 09/17/2012 1230 09/20/2012 0945  HGB 8.7* -- 9.2* -- -- -- -- -- -- -- --  HCT 24.9* -- 26.6* -- -- -- -- 23.6* -- -- --  PLT 92* -- 116* -- -- -- -- 108* -- -- --  APTT -- -- -- -- -- -- -- -- -- -- 24  LABPROT -- -- -- -- -- -- -- -- -- -- 15.4*  INR -- -- -- -- -- -- -- -- -- -- 1.24  HEPARINUNFRC 0.42 -- -- 0.62 -- -- 0.68 -- -- -- --  CREATININE -- -- 1.24 -- -- -- -- -- 1.28 1.42* --  CKTOTAL -- -- -- -- -- -- -- -- -- -- --  CKMB -- -- -- -- -- -- -- -- -- -- --  TROPONINI -- 10.57* -- -- 8.94* 10.70* -- -- -- -- --    Estimated Creatinine Clearance: 49 ml/min (by C-G formula based on Cr of 1.24).   Assessment: 53 YOM with chest block and hypotension, had elevated troponins and started on heparin per pharmacy. Heparin level this morning is 0.42 which is therapeutic. Patient is also on vancomycin and Zosyn per pharmacy for suspected pneumonia. Also is on Levaquin and Tamiflu. Respiratory culture showed abundant E.Coli- other cultures are no growth to date. SCr is 1.24 with CrCl ~42mL/min. CBC decreasing slightly-platelets now 92. No bleeding noted.  Goal of Therapy:  Heparin level 0.3-0.7 units/ml Monitor platelets by anticoagulation protocol: Yes Vancomycin trough 15-20 mcg/mL   Plan:  1. Continue heparin at 900 units/hr 2. Daily heparin level and CBC 3. Follow platelet trend 4.  Continue Zosyn 3.375gm IV Q8hr EI 5. Continue Vanomycin 1250mg  IV Q24hr 6. Follow up LOT, clinical progression, culture results 7. Vancomycin trough at steady state if clinically indicated  Jaideep Pollack D. Sondos Wolfman, PharmD Clinical Pharmacist Pager: [phone] 09/08/2012 9:03 AM

## 2012-09-08 NOTE — Evaluation (Signed)
Clinical/Bedside Swallow Evaluation Patient Details  Name: William Collins MRN: [id number] Date of Birth: 1927-04-08  Today's Date: 09/08/2012 Time: 0800-0824 SLP Time Calculation (min): 24 min  Past Medical History:  Past Medical History  Diagnosis Date  . HTN (hypertension)   . CKD (chronic kidney disease)   . DM (diabetes mellitus)   . CAD (coronary artery disease)    Past Surgical History: No past surgical history on file. HPI:  79 year old male with PMH significant for CAD, DM, HTN , CKD stage IV, who was found down by wife on the floor at home with fecal material and confusion. EMS gave IV Atropin for HR in 30s . Hypotensive and complete Heart block . IV dopamine started and transcutaneous pacemake placed. Patient was transported by [facility] for [facility] evaluation. On arrival patient developed acute respiratory failure and was intubated 1/16-1/17. MD reports NSTEMI.    Assessment / Plan / Recommendation Clinical Impression  Pt demonstrates impaired swallow function with overt signs of aspiration exclusively with thin liquids. Suspect decreased airway protection s/p brief intubation complicated by shortness of breath with minimal effort (speech, consumption of PO) complicating timing of swallow response with respiration. Pt is at risk of aspiration particularly with thin liquids. With therapeutic intervention, pt clearly manages purees and thickened liquids with less distress and no signs of aspriation. Recommend a dys 3 (mechanical soft diet) with nectar thick liquids with full supervision form RN. Pt unlikely to telerate a whole meal due to poor respiratory endurance but may benefit from frequent sips/snacks. RN aware. SLP will f/u for tolerance/upgrade.     Aspiration Risk  Moderate    Diet Recommendation Dysphagia 3 (Mechanical Soft);Nectar-thick liquid   Liquid Administration via: Cup;Straw Medication Administration: Whole meds with puree Supervision: Full  supervision/cueing for compensatory strategies;Patient able to self feed Compensations: Slow rate;Small sips/bites Postural Changes and/or Swallow Maneuvers: Seated upright 90 degrees    Other  Recommendations Oral Care Recommendations: Oral care BID Other Recommendations: Order thickener from pharmacy   Follow Up Recommendations   (TBD)    Frequency and Duration min 2x/week  2 weeks   Pertinent Vitals/Pain NA    SLP Swallow Goals Patient will consume recommended diet without observed clinical signs of aspiration with: Minimal assistance Patient will utilize recommended strategies during swallow to increase swallowing safety with: Minimal cueing   Swallow Study Prior Functional Status       General HPI: 79 year old male with PMH significant for CAD, DM, HTN , CKD stage IV, who was found down by wife on the floor at home with fecal material and confusion. EMS gave IV Atropin for HR in 30s . Hypotensive and complete Heart block . IV dopamine started and transcutaneous pacemake placed. Patient was transported by EMS [facility] for [facility] evaluation. On arrival patient developed acute respiratory failure and was intubated 1/16-1/17. MD reports NSTEMI.  Type of Study: Bedside swallow evaluation Previous Swallow Assessment: none in chart Diet Prior to this Study: NPO Temperature Spikes Noted: No Respiratory Status: Supplemental O2 delivered via (comment) History of Recent Intubation: Yes Date extubated: 09/07/12 Behavior/Cognition: Alert;Cooperative;Pleasant mood Oral Cavity - Dentition: Adequate natural dentition Self-Feeding Abilities: Able to feed self Patient Positioning: Upright in bed Baseline Vocal Quality: Hoarse (mildly hoarse) Volitional Cough: Strong Volitional Swallow: Able to elicit    Oral/Motor/Sensory Function Overall Oral Motor/Sensory Function: Other (comment) (poor gag reflex)   Ice Chips Ice chips: Impaired Presentation: Spoon Pharyngeal Phase Impairments: Cough  - Immediate  Thin Liquid Thin Liquid: Impaired Presentation: Cup;Self Fed Pharyngeal  Phase Impairments: Suspected delayed Swallow;Throat Clearing - Immediate;Cough - Immediate    Nectar Thick Nectar Thick Liquid: Within functional limits Presentation: Cup;Self Fed;Straw   Honey Thick Honey Thick Liquid: Not tested   Puree Puree: Within functional limits   Solid   GO    Solid: Within functional limits      [facility], [place] CCC-SLP [phone]  William Collins 09/08/2012,8:35 AM

## 2012-09-09 ENCOUNTER — Inpatient Hospital Stay (HOSPITAL_COMMUNITY): Payer: Medicare Other

## 2012-09-09 LAB — POCT I-STAT 3, ART BLOOD GAS (G3+)
Acid-base deficit: 12 mmol/L — ABNORMAL HIGH (ref 0.0–2.0)
O2 Saturation: 95 %
Patient temperature: 97.2
Patient temperature: 97.3
TCO2: 14 mmol/L (ref 0–100)
pCO2 arterial: 31.9 mmHg — ABNORMAL LOW (ref 35.0–45.0)
pH, Arterial: 7.228 — ABNORMAL LOW (ref 7.350–7.450)
pH, Arterial: 7.263 — ABNORMAL LOW (ref 7.350–7.450)

## 2012-09-09 LAB — MAGNESIUM: Magnesium: 1.6 mg/dL (ref 1.5–2.5)

## 2012-09-09 LAB — LEGIONELLA ANTIGEN, URINE

## 2012-09-09 LAB — CBC
Hemoglobin: 8.7 g/dL — ABNORMAL LOW (ref 13.0–17.0)
MCH: 34.3 pg — ABNORMAL HIGH (ref 26.0–34.0)
Platelets: 74 10*3/uL — ABNORMAL LOW (ref 150–400)
RBC: 2.54 MIL/uL — ABNORMAL LOW (ref 4.22–5.81)

## 2012-09-09 LAB — BASIC METABOLIC PANEL
CO2: 14 mEq/L — ABNORMAL LOW (ref 19–32)
Calcium: 7.6 mg/dL — ABNORMAL LOW (ref 8.4–10.5)
GFR calc non Af Amer: 32 mL/min — ABNORMAL LOW (ref >90)
Glucose, Bld: 293 mg/dL — ABNORMAL HIGH (ref 70–99)
Potassium: 4.6 mEq/L (ref 3.5–5.1)
Sodium: 135 mEq/L (ref 135–145)

## 2012-09-09 LAB — GLUCOSE, CAPILLARY
Glucose-Capillary: 278 mg/dL — ABNORMAL HIGH (ref 70–99)
Glucose-Capillary: 254 mg/dL — ABNORMAL HIGH (ref 70–99)
Glucose-Capillary: 194 mg/dL — ABNORMAL HIGH (ref 70–99)

## 2012-09-09 LAB — HEPARIN LEVEL (UNFRACTIONATED): Heparin Unfractionated: 0.31 IU/mL (ref 0.30–0.70)

## 2012-09-09 LAB — PHOSPHORUS: Phosphorus: 3.8 mg/dL (ref 2.3–4.6)

## 2012-09-09 MED ORDER — SODIUM CHLORIDE 0.9 % IV SOLN
5.0000 mg/h | INTRAVENOUS | Status: DC
  Administered 2012-09-09: 5 mg/h via INTRAVENOUS
  Filled 2012-09-09: qty 10

## 2012-09-09 MED ORDER — LORAZEPAM 2 MG/ML IJ SOLN: 0.5000 mg | INTRAMUSCULAR | Status: DC | PRN

## 2012-09-09 MED ORDER — NOREPINEPHRINE BITARTRATE 1 MG/ML IJ SOLN
2.0000 ug/min | INTRAVENOUS | Status: DC | PRN
  Administered 2012-09-09: 20 ug/min via INTRAVENOUS
  Administered 2012-09-09: 13 ug/min via INTRAVENOUS
  Filled 2012-09-09 (×2): qty 16

## 2012-09-12 LAB — CULTURE, BLOOD (ROUTINE X 2)
Culture: NO GROWTH
Culture: NO GROWTH

## 2012-09-22 NOTE — Progress Notes (Signed)
eLink Physician-Brief Progress Note Patient Name: William Collins DOB: July 09, 1927 MRN: [id number]  Date of Service  09-12-12   HPI/Events of Note    Lab Sep 12, 2012 0059 09/08/12 0439 09/07/12 2223 09/07/12 1031 09/07/12 0534  PHART 7.228* 7.351 7.352 7.378 7.498*  PCO2ART 31.9* 35.6 34.9* 30.6* 20.1*  PO2ART 84.0 64.0* 68.0* 73.0* 137.0*  HCO3 13.4* 19.7* 19.4* 18.0* 15.4*  TCO2 14 21 20 19  16.1  O2SAT 95.0 91.0 93.0 94.0 99.2   RN concerned about desats and increased fio2 to 100%. uPulse ox tracing is very poor but reads 93%. ABG shows metabl acidosis but compensating ok with po2 80s  eICU Interventions  Monitor status; advised pulse ox > 88% is satisfactory Check abg in doubt for decompensation so family can be informed   Intervention Category Intermediate Interventions: Other: (Acid/base)  [name] 09/12/2012, 1:06 AM

## 2012-09-22 NOTE — Progress Notes (Signed)
Patient deceased at this time with family at bedside. No respirations, heart rate, or vital signs for one full minute. Witnessed time of death with second nurse Therapist, nutritional at 13:20. Provided family with emotional support and private time with their loved one. Dr. Vassie Loll paged and notified of patient time of death.

## 2012-09-22 NOTE — Progress Notes (Signed)
ANTICOAGULATION AND ANTIBIOTIC CONSULT NOTE - Follow Up Consult  Pharmacy Consult for heparin Indication: chest pain/ACS   No Known Allergies  Patient Measurements: Height: 5\' 9"  (175.3 cm) Weight: 194 lb 3.6 oz (88.1 kg) IBW/kg (Calculated) : 70.7  Heparin Dosing Weight: 85.7kg  Vital Signs: Temp: 97.3 F (36.3 C) (01/19 0000) Temp src: Axillary (01/19 0000) BP: 85/William mmHg (01/19 0400) Pulse Rate: 72  (01/19 0500)  Labs:  Basename October 07, 2012 0228 09/08/12 0850 09/08/12 0450 09/08/12 0126 09/07/12 2217 09/07/12 2200 09/07/12 1910 09/07/12 0320 09/12/2012 0945  HGB 8.7* -- 8.7* -- -- -- -- -- --  HCT 25.8* -- 24.9* -- 26.6* -- -- -- --  PLT 74* -- 92* -- 116* -- -- -- --  APTT -- -- -- -- -- -- -- -- 24  LABPROT -- -- -- -- -- -- -- -- 15.4*  INR -- -- -- -- -- -- -- -- 1.24  HEPARINUNFRC 0.31 -- 0.42 -- -- 0.62 -- -- --  CREATININE 1.83* -- -- -- 1.24 -- -- 1.28 --  CKTOTAL -- -- -- -- -- -- -- -- --  CKMB -- -- -- -- -- -- -- -- --  TROPONINI -- 11.41* -- 10.57* -- -- 8.94* -- --    Estimated Creatinine Clearance: 33.6 ml/min (by C-G formula based on Cr of 1.83).   Assessment: William Collins with chest block and hypotension, had elevated troponins and started on heparin per pharmacy. Heparin level this morning is 0.31 which is therapeutic. CBC decreasing slightly-platelets now 74. No bleeding noted.  Goal of Therapy:  Heparin level 0.3-0.7 units/ml Monitor platelets by anticoagulation protocol: Yes   Plan:  1. Continue heparin at 900 units/hr 2. Daily heparin level and CBC 3. Follow platelet trend--please consider d/c heparin if platelets continue to drop 4. Monitor for signs and symptoms of bleeding   Thank you,  Franchot Erichsen, Pharm.D. Clinical Pharmacist   Pager: [phone] October 07, 2012 7:36 AM

## 2012-09-22 NOTE — Progress Notes (Signed)
Patient ID: William Collins, male   DOB: 1926-12-27, 79 y.o.   MRN: [id number] Patient is deteriorating as noted by critical care note. When I turned his pacer voltage output  down, he is Asystolic.  Discussed with nursing staff. He is not a candidate for palliative care since he is totally pacer dependent. We'll coordinate with critical care to withdraw support.

## 2012-09-22 NOTE — Progress Notes (Signed)
Name: KERRI ASCHE MRN: [id number] DOB: 1927-06-29 LOS: 3  PCCM  PROGRESS NOTE  History of Present Illness:  79 year old male with PMH significant for CAD, DM, HTN , CKD stage IV, who was found down by wife on the floor at home with fecal material and confusion. EMS gave IV Atropin for HR in 30s . Hypotensive and complete Heart block . IV dopamine started and transcutaneous pacemake placed. Patient was transported by EMS to [facility] for [facility] evaluation. On arrival patient developed acute respiratory failure and was intubated. PCCM was consulted.   LINES / TUBES:  ETT 1/16 >> 1/17 Left femoral (placed to slavage left for pacer perm)1/16>>  Right femoral A line 1/16 >> 1/17 Right pacer line 1/16>>>   CULTURES:  Blood cx 1/16>> NTD Resp cx 1/16 >> Mixed flora>e. Coli sensitive for cipro.  Urine cx 1/16 >> NTD Resp viral panel 1/16 >>  Urine leg 1/16 >> Neg Urine strep 1/16 >> negative  Influenza nasal  1/16 >>swab negative   ANTIBIOTICS:  Vanc 1/16 >> 1/18 Zosyn 1/16 >> 1/18 Tamiflu 1/16 >>  Levaquin 1/15>>>  SIGNIFICANT EVENTS:  [facility] head and Neck 1/16 from  [facility] : negative for acute process 1/16 Arrived to floor with respiratory distress and Hypotensive on Levo and Dopamine > intubated  1/16 After discussion  with patient's wife : DNR status  1/16 2 D echo : Left ventricle: The cavity size was normal. There appeared to be septal-lateral dyssynchrony. The mid to apical inferior wall appears akinetic, mid to apical septum severely hypokinetic, apical wall severely hypokinetic, lateral wall kinetic. Other walls poorly visualized.Systolic function was severely reduced. The estimated ejection fraction was in the range of 25% to 30%. 1/17 Extubated continues on BiPAP.   Overnight Events:  More confused overnight, yelling out.  More SOB with desaturations.  Placed on Vent mask at 50% with continued desats to the high 80s.  Breathing 40-50 per minute.  Hypotensive requiring  increased pressor support, Levo to 25 this AM.  WBC count rising but continues to be afebrile. UOP dropping with only 675 over the last 24 hours despite lasix.   Vital Signs: Temp:  [97.2 F (36.2 C)-97.6 F (36.4 C)] 97.3 F (36.3 C) (01/19 0000) Pulse Rate:  [48-126] 72  (01/19 0500) Resp:  [26-43] 33  (01/19 0500) BP: (77-105)/(25-80) 85/67 mmHg (01/19 0400) SpO2:  [78 %-100 %] 100 % (01/19 0500) Arterial Line BP: (82-116)/(45-61) 106/56 mmHg (01/19 0500) FiO2 (%):  [30 %-100 %] 50 % (01/19 0240) Weight:  [194 lb 3.6 oz (88.1 kg)] 194 lb 3.6 oz (88.1 kg) (01/19 0500) I/O last 3 completed shifts: In: 3672.6 [P.O.:580; I.V.:2790.6; IV Piggyback:302] Out: 1305 [Urine:1305]  Physical Examination: General: Awake, confused, moderate respiratory distress though states his breathing is fine.  "I'm so sick" Neuro: arousable and confused.   HEENT:dry mucosa  Cardiovascular: Paced at 100 , no murmurs noted  Lungs: Rhonchi present, coarse crackles with shallow breathing. no wheezing  Abdomen: Soft, BS present EXT : tr edema   Ventilator settings: Vent Mode:  [-]  FiO2 (%):  [30 %-100 %] 50 %  Basename 09/01/2012 0218 08/24/2012 0059 09/08/12 0439  PHART 7.263* 7.228* 7.351  PO2ART 59.0* 84.0 64.0*  TCO2 15 14 21   HCO3 14.2* 13.4* 19.7*   Labs and Imaging:   Basic Metabolic Panel:  Lab 08/27/2012 7829 09/08/12 0450 09/07/12 2217  NA 135 -- 134*  K 4.6 -- 3.6  CL 99 -- 101  CO2 14* -- 19  GLUCOSE 293* -- 335*  BUN 29* -- 19  CREATININE 1.83* -- 1.24  CALCIUM 7.6* -- 7.4*  MG 1.6 -- 1.1*  PHOS 3.8 3.2 --   Liver Function Tests:  Lab 2012-09-18 0945  AST 24  ALT 16  ALKPHOS 34*  BILITOT 0.3  PROT 5.4*  ALBUMIN 3.1*    CBC:  Lab 08/27/2012 0228 09/08/12 0450 09/07/12 0445 09/18/12 0945  WBC 13.3* 11.9* -- --  NEUTROABS -- -- 9.1* 10.2*  HGB 8.7* 8.7* -- --  HCT 25.8* 24.9* -- --  MCV 101.6* 98.8 -- --  PLT 74* 92* -- --   Cardiac Enzymes:  Lab 09/08/12 0850 09/08/12  0126 09/07/12 1910  CKTOTAL -- -- --  CKMB -- -- --  CKMBINDEX -- -- --  TROPONINI 11.41* 10.57* 8.94*   BNP:  Lab 09/18/2012 0945  PROBNP 2207.0*    CBG:  Lab 09/03/2012 0338 09/08/12 2323 09/08/12 1957 09/08/12 1629 09/08/12 1231 09/08/12 0810  GLUCAP 254* 278* 297* 278* 316* 212*   Hemoglobin A1C:  Lab 2012-09-18 0945  HGBA1C 6.3*    Thyroid Function Tests:  Lab 09-18-2012 0932  TSH 1.716  T4TOTAL --  FREET4 --  T3FREE --  THYROIDAB --   Coagulation:  Lab 09/18/2012 0945  LABPROT 15.4*  INR 1.24   Urinalysis:  Lab 2012/09/18 2327  COLORURINE RED*  LABSPEC 1.026  PHURINE 5.0  GLUCOSEU >1000*  HGBUR LARGE*  BILIRUBINUR NEGATIVE  KETONESUR 40*  PROTEINUR 100*  UROBILINOGEN 0.2  NITRITE NEGATIVE  LEUKOCYTESUR SMALL*    Lab 09/08/12 0450 09/07/12 0320 09-18-2012 0945 2012/09/18 0825  LATICACIDVEN -- -- -- 5.6*  PROCALCITON 1.70 3.45 0.37 --   CXR 1/18 Persistently reduced lung volumes with findings suggestive of  pulmonary edema, trace bilateral effusions and perihilar/ bibasilar  opacities, atelectasis versus infiltrate.  CXR 1/19: poor volumes today.  Increased bibasilar opacities.    Assessment and Plan: PULMONARY  ASSESSMENT:  Acute respiratory failure: extubated 1/17 and family made the decision for DNR/DNI.  Decompensating this morning with rapid breathing and worsening appearance of the CXR.  Increased need for O2 with the 50% venti mask this morning.   Pulmonary infiltrate -appears to be in fluid overload w/ + bal  Resp alkalosis  Aspiration - sputum cx + e.coli   PLAN:  No reintubation Diuresis per cards   CARDIOVASCULAR  ASSESSMENT:  Complete Heart block s/p temporal wire placement  Hypotension:  Increasing pressor need today, levo increased to 25 this AM.   Elevated Trop > 1 > 5 > 20  Hx of CAD  Hx HTN  Cortisol level 26 Lactic acid 5.6 PCT 03>3.4 >  CVP today 10   PLAN:  Dopamine d/c 1/16  Titrate Levo for MAP of 65  Heparin drip    Asa  D/c Solu cortef when no longer hypotensive (cortisol level of 26 however). No beta blocker  Per Cardiology for possible permanent pacer  Diuresis for neg bal as tolerated per cards   RENAL  ASSESSMENT:  CKD unclear stage:  Creatinine this AM rising with decreased urinary output despite lasix.   Cath placed by Urology.   PLAN:   Monitor renal function and urine output   GASTROINTESTINAL  ASSESSMENT:  Swallow eval 1/17: Aspiration with thin liquids and decreased airway protection after intubation.    PLAN:  Diet per ST (Dysphagia III with nectar thick liquids)  HEMATOLOGIC  ASSESSMENT:  Thrombocytopenia  - 130 > 108 >  116 > 92 > 74: may need to consider stopping heparin drip.  Macrocytic Anemia: HgB stable over the last 3 days.  No overt signs of bleeding.  Currently on Heparin drip  Coags on admission WNL   PLAN:  F/u CBC Transfuse if < 8   INFECTIOUS  ASSESSMENT:  Pulmonary infiltrates - Aspiration ? -sp + e.coli sensitive to cipro.  Hypotension  Septic shock -  PLAN:  - Continue levaquin & Tamiflu for now    ENDOCRINE  DM , Hgb A1c 6.3  TSH 1.17  Cortisol level  26.7  ASSESSMENT:  Stress steroids  PLAN:  SSI      NEUROLOGIC  ASSESSMENT:  CT and CT neck negative from outside hospital   PLAN: Fent prn   Clinical Summary: Patient is an 79 year old man with multiple co morbidities who presented with acute respiratory failure after being found down.  Over the last 24 hours has become increasingly confused with increased O2 needs as well as increased need for pressor support.  Discussed with family - transition to full comfort with morphine drip.   Best practices / Disposition:  -->DNR -->Heparin gtt for DVT Px -->Protonix for GI Px  PRIBULA,CHRISTOPHER, MD Internal Medicine Resident, PGY III Co-Chief Resident, Internal Medicine Pager: [phone]  Care during the described time interval was provided by me and/or other providers on the critical  care team.  I have reviewed this patient's available data, including medical history, events of note, physical examination and test results as part of my evaluation  CC time x  35 m  ALVA,RAKESH V.  08/27/2012 7:37 AM

## 2012-09-22 DEATH — deceased

## 2012-10-11 DIAGNOSIS — N179 Acute kidney failure, unspecified: Secondary | ICD-10-CM

## 2012-10-11 DIAGNOSIS — R570 Cardiogenic shock: Secondary | ICD-10-CM

## 2012-10-11 DIAGNOSIS — R55 Syncope and collapse: Secondary | ICD-10-CM

## 2012-10-11 NOTE — Discharge Summary (Signed)
Patient ID: William Collins,  MRN: [id number], DOB/AGE: [birth year] 77 y.o.  Admit date: 09/04/2012 Discharge date: 10/11/2012  Discharge Diagnoses Principal Problem:   Complete heart block Active Problems:   HTN (hypertension)   DM (dermatomyositis)   CKD (chronic kidney disease)   CAD (coronary artery disease)   Acute respiratory failure   Hypotension   Pulmonary infiltrate   Syncope   Cardiogenic shock   Acute renal failure   Allergies No Known Allergies  Procedures  09/02/2012 Placement of a Temporary transvenous pacemaker ________  2D Echo 08/29/2012  Study Conclusions  - Left ventricle: The cavity size was normal. There appeared to be septal-lateral dyssynchrony. The mid to apical inferior Addison Whidbee appears akinetic, mid to apical septum severely hypokinetic, apical Alin Hutchins severely hypokinetic, lateral Law Corsino kinetic. Other walls poorly visualized. Systolic function was severely reduced. The estimated ejection fraction was in the range of 25% to 30%. - Aortic valve: Poorly visualized. - Mitral valve: Poorly visualized. No doppler. - Left atrium: The atrium was mildly dilated. - Right ventricle: The cavity size was normal. Systolic function was normal. ________   History of Present Illness  Mr. Silvera was a 79 year old white male who was found on the floor by his wife after having a syncopal event and most likely a seizure. EMS found him to be in complete heart block and very hypotensive. Transcutaneous pacer was placed after atropine did not improve his heart block. In the emergency room a temporary pacer was placed by Dr. Ladona Ridgel. He was admitted to the coronary care unit. He developed respiratory failure and had to be intubated as well.  He was in acute on chronic renal failure, acidotic, and required pressors. He had intermittent periods of being lucid and made it clear as did his family that he did not want to be resuscitated after extubation.  He began to  deteriorate on Sunday, January 20. I turned his pacer down and he was totally pacemaker dependent with asystole.  Critical care medicine initiated a morphine drip after talking to the patient and his family. He subsequently died peacefully in the afternoon on January 20. Please see the nurse's note. No autopsy was obtained    Labs  CBC Lab Results  Component Value Date   WBC 13.3* 10/08/2012   HGB 8.7* 10/08/12   HCT 25.8* 08-Oct-2012   MCV 101.6* 10-08-2012   PLT 74* 10-08-2012    Basic Metabolic Panel Lab Results  Component Value Date   CREATININE 1.83* 2012-10-08   BUN 29* 10-08-12   NA 135 Oct 08, 2012   K 4.6 Oct 08, 2012   CL 99 10/08/2012   CO2 14* October 08, 2012    Liver Function Tests Lab Results  Component Value Date   ALT 16 09/03/2012   AST 24 08/31/2012   ALKPHOS 34* 09/16/2012   BILITOT 0.3 08/25/2012   Cardiac Enzymes Lab Results  Component Value Date   TROPONINI 11.41* 09/08/2012   Medications at the time of death.     Sallee Lange NP 10/11/2012, 3:47 PM

## 2014-04-24 IMAGING — CT CT CERVICAL SPINE WITHOUT CONTRAST
3 series · 16 of 33 positions shown, 19 images · non-contrast
Comparison: None

REASON FOR EXAM: fall with head trauma
COMMENTS:

PROCEDURE:     CT  - CT CERVICAL SPINE WO  - September 06, 2012  [DATE]
RESULT:     Clinical Indication: Fall
TECHNIQUE: Multiple axial CT images from the skull base to the mid vertebral
body of T1. obtained with sagittal and coronal reformatted images provided.

[Series 3: axial · axial · 0.33mm/px · z∈[-168,-21]mm · 8 of 88 slices shown, 10 images]
[im 7/88  soft-tissue]
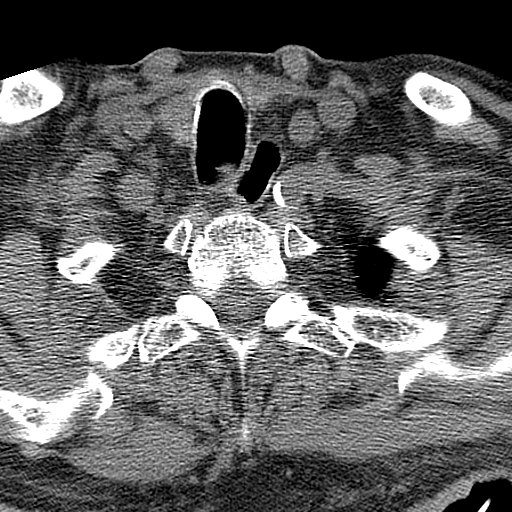
[im 7/88  bone]
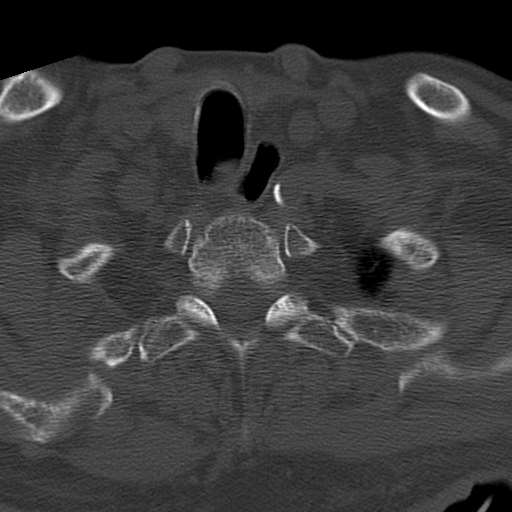
[im 21/88  bone]
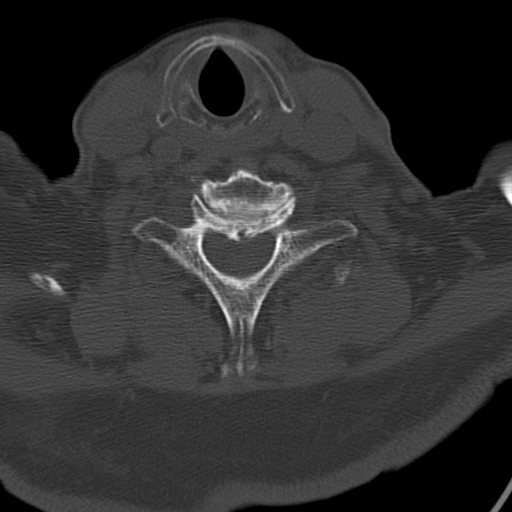
[im 27/88  bone]
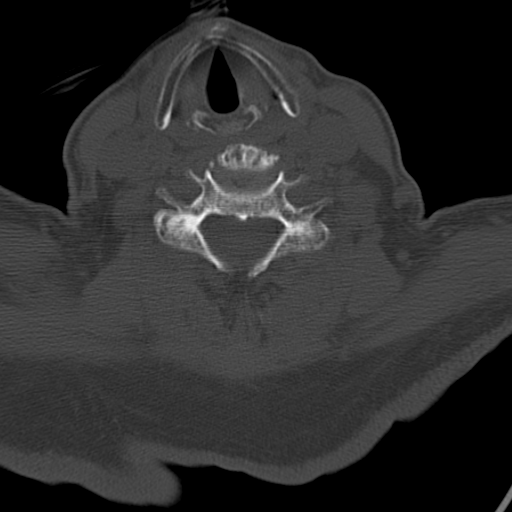
[im 41/88  bone]
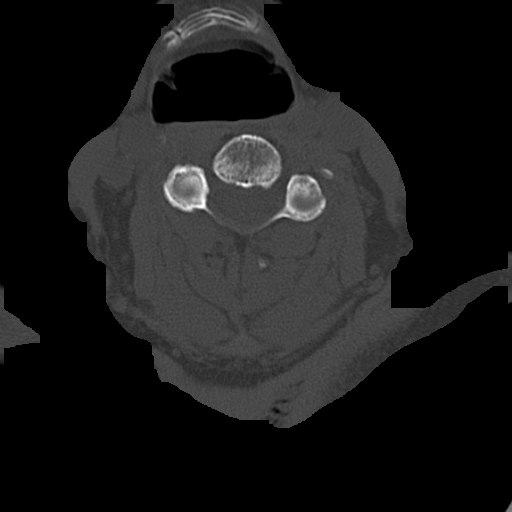
[im 47/88  soft-tissue]
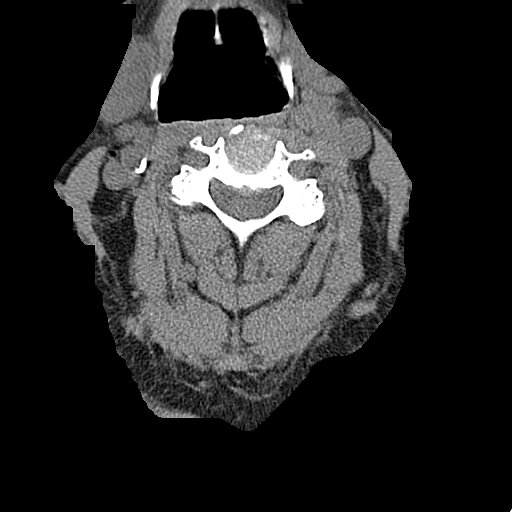
[im 47/88  bone]
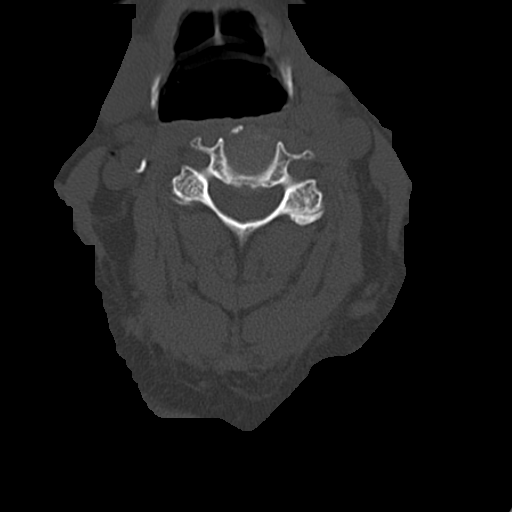
[im 61/88  bone]
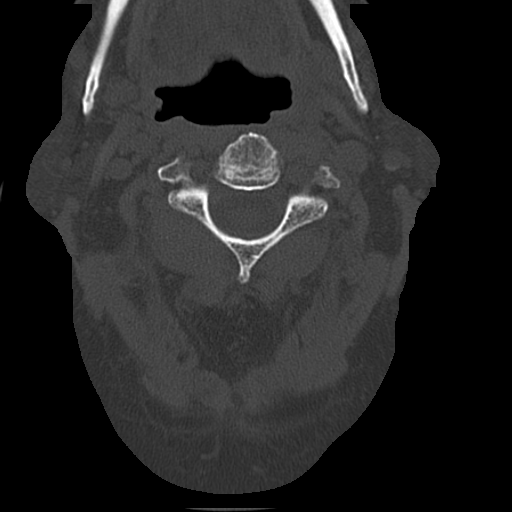
[im 67/88  bone]
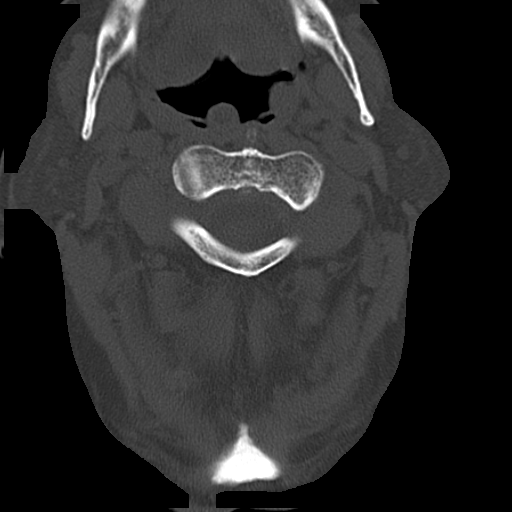
[im 81/88  bone]
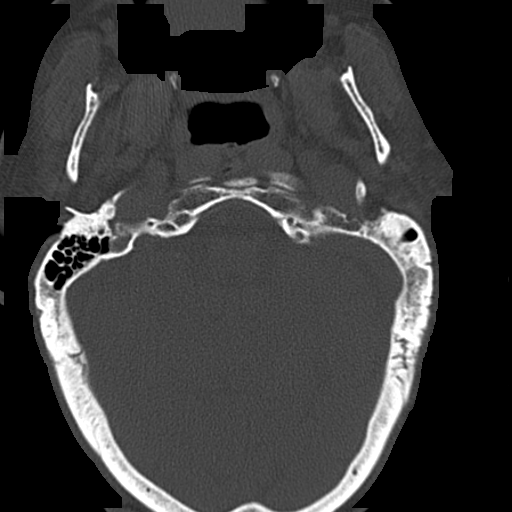

[Series 4: sagittal · sagittal · 0.41mm/px · 5 of 50 slices shown, 6 images]
[im 17/50  bone]
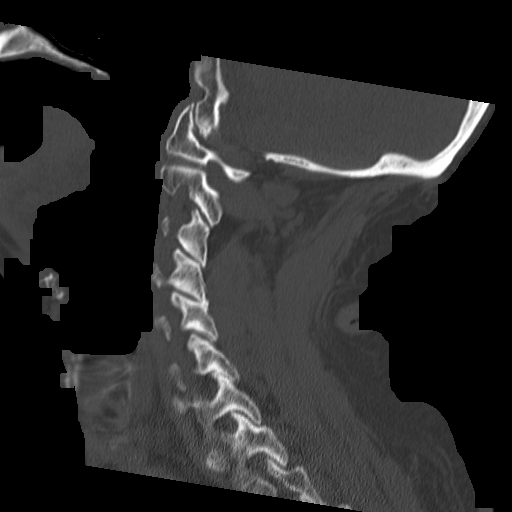
[im 21/50  bone]
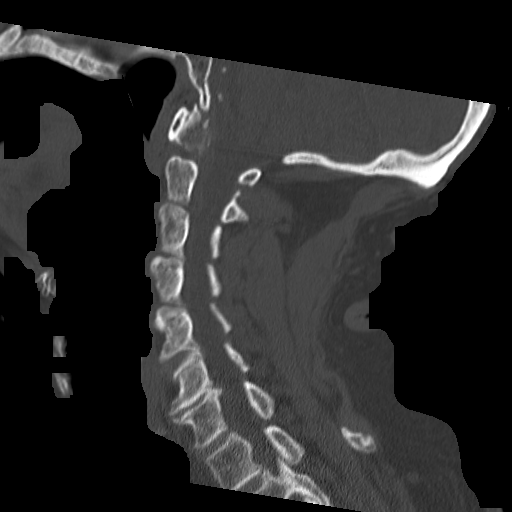
[im 25/50  soft-tissue]
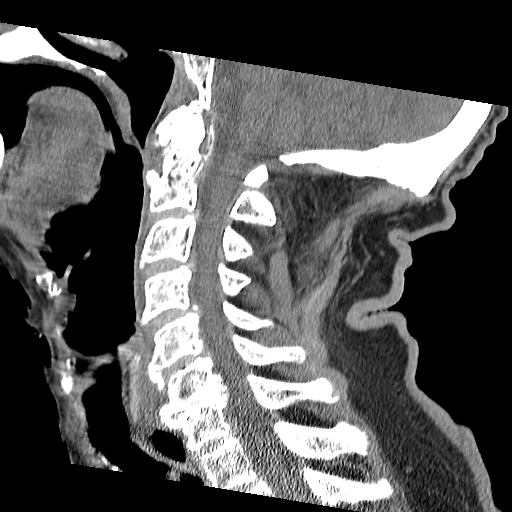
[im 25/50  bone]
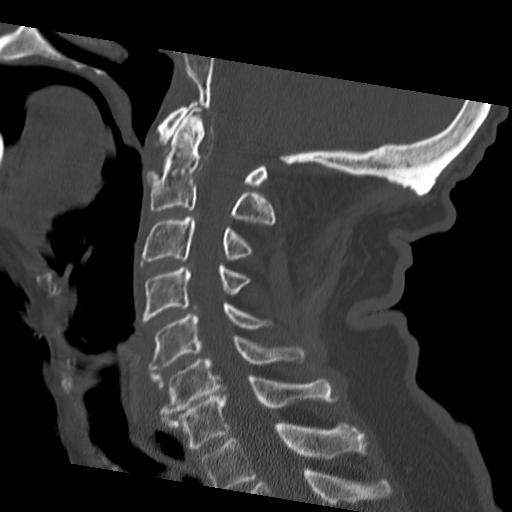
[im 29/50  bone]
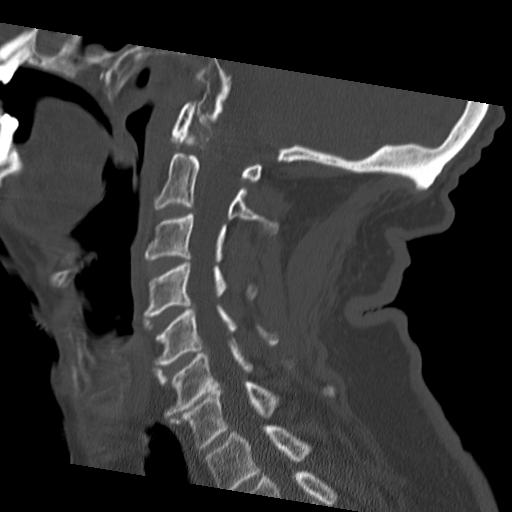
[im 33/50  bone]
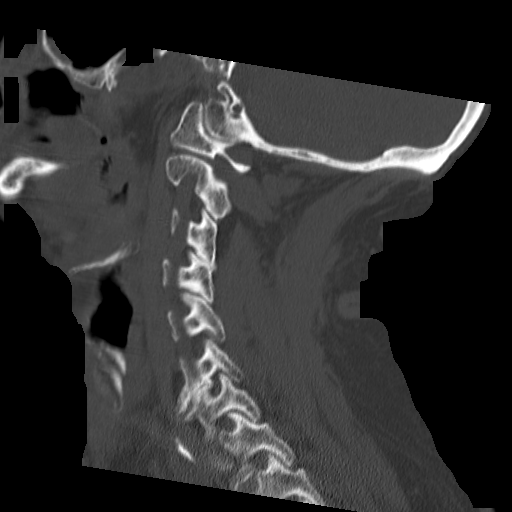

[Series 5: coronal · coronal · 0.41mm/px · 3 of 61 slices shown]
[im 13/61  bone]
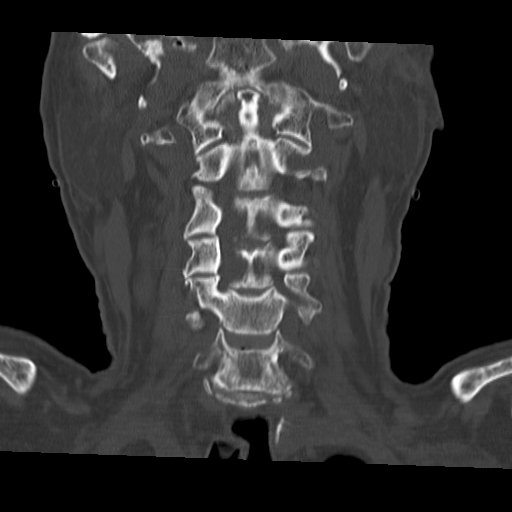
[im 25/61  bone]
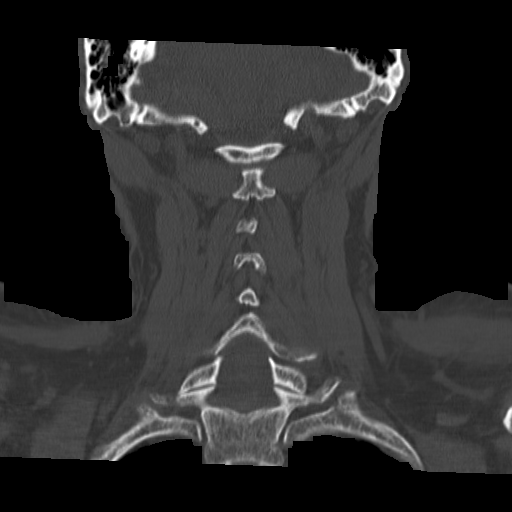
[im 37/61  bone]
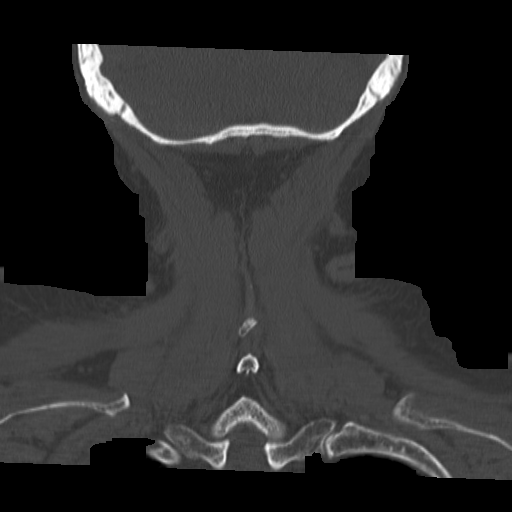

[16 of 33 positions shown; findings below may reference images not displayed]

FINDINGS: The alignment is anatomic. The vertebral body heights are maintained. There
is no acute fracture or static listhesis. The prevertebral soft tissues are
normal. The intraspinal soft tissues are not fully imaged on this
examination due to poor soft tissue contrast, but there is no soft tissue
gross abnormality.

There is degenerative disease at C6-C7 and with a broad-based disc
osteophyte complex resulting in mild impression on the ventral thecal sac.
There is a mild broad-based disc osteophyte complex at C4-C5.

The visualized portions of the lung apices demonstrate no focal abnormality.
IMPRESSION: 1. No acute osseous injury of the cervical spine.

2. Ligamentous injury is not evaluated. If there is high clinical concern
for ligamentous injury, consider MRI or flexion/extension radiographs as
clinically indicated and tolerated.

[REDACTED]

## 2014-04-24 IMAGING — CT CT HEAD WITHOUT CONTRAST
2 of 4 series · 16 of 30 positions shown, 19 images · non-contrast
Comparison: none

REASON FOR EXAM: fall, altered mental status
COMMENTS:

PROCEDURE:     CT  - CT HEAD WITHOUT CONTRAST  - September 06, 2012  [DATE]
RESULT:     Comparison:  None
TECHNIQUE: Multiple axial images from the foramen magnum to the vertex were
obtained without IV contrast.

[Series 2: without · axial · non-contrast · 0.45mm/px · z∈[-27,+108]mm · 11 of 33 slices shown, 14 images]
[im 3/33  brain]
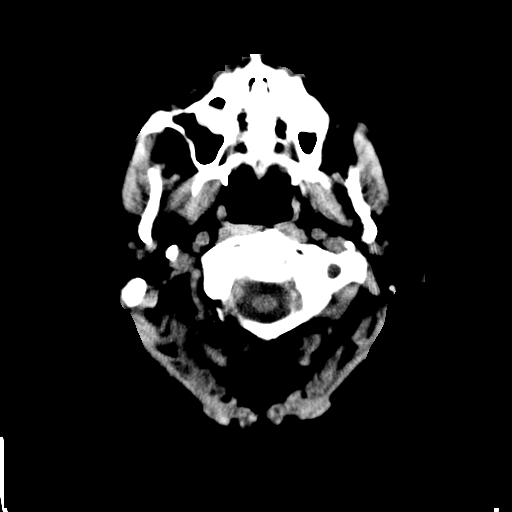
[im 3/33  bone]
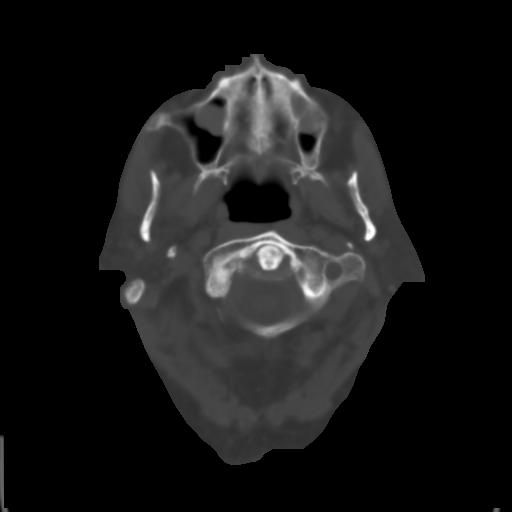
[im 6/33  brain]
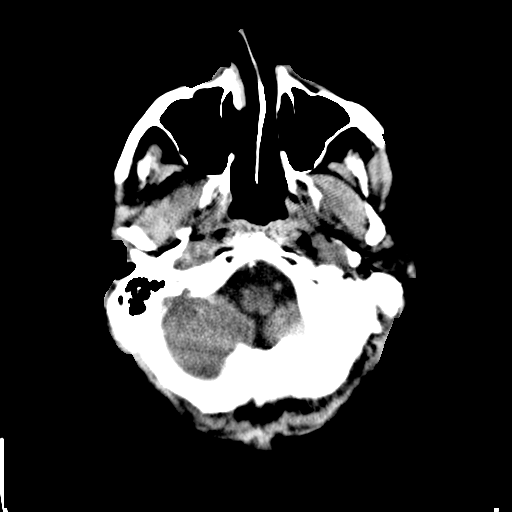
[im 9/33  brain]
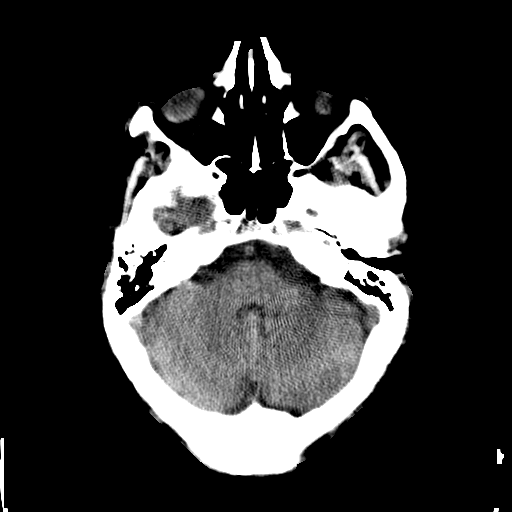
[im 11/33  brain]
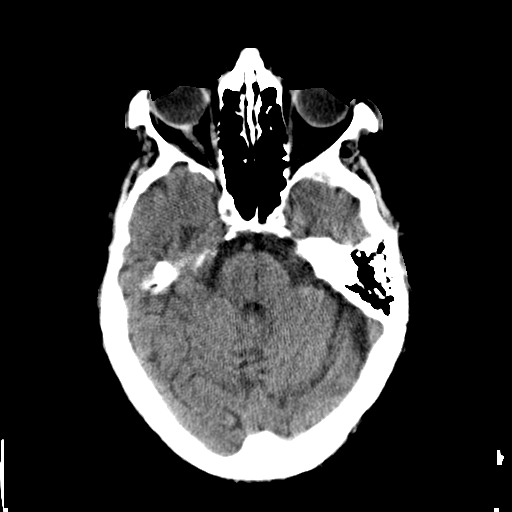
[im 14/33  brain]
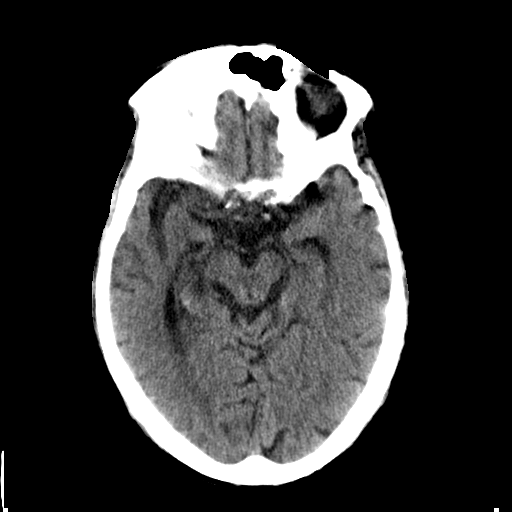
[im 14/33  bone]
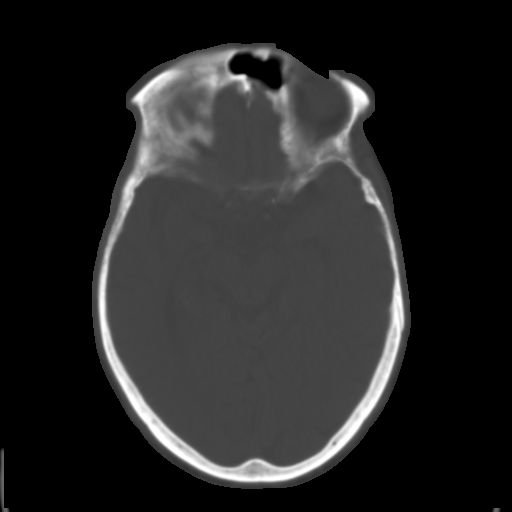
[im 17/33  brain]
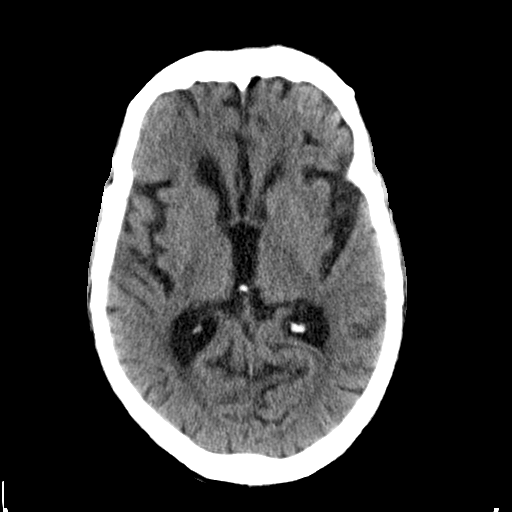
[im 19/33  brain]
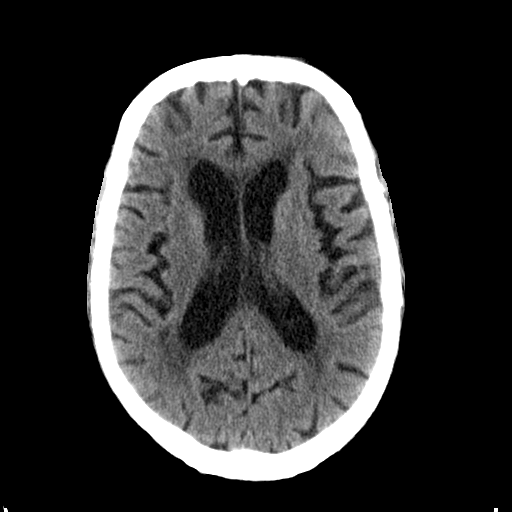
[im 22/33  brain]
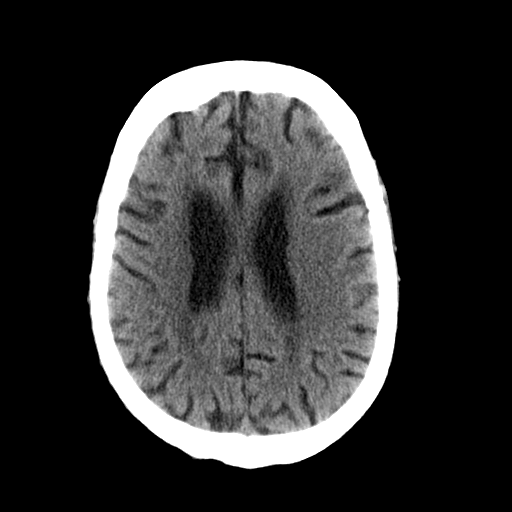
[im 25/33  brain]
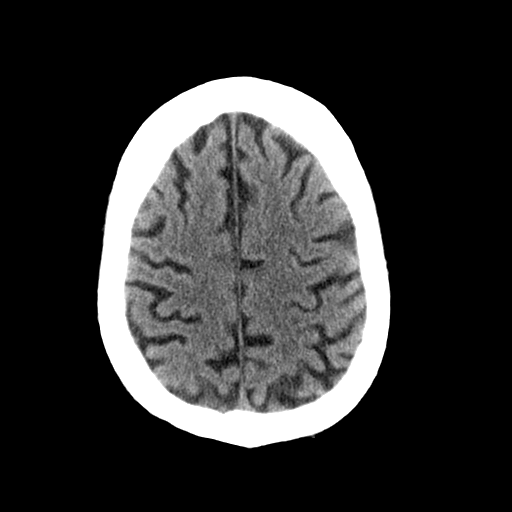
[im 25/33  bone]
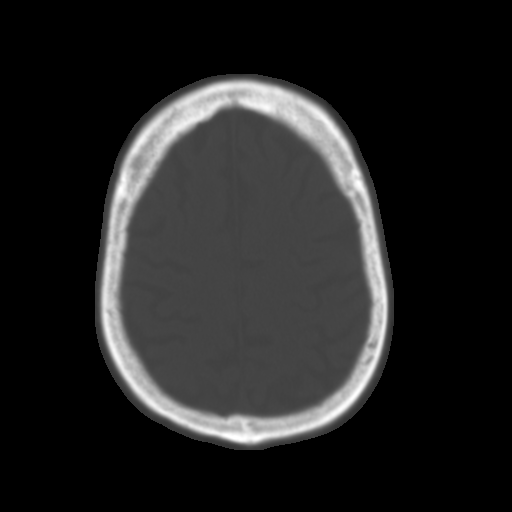
[im 27/33  brain]
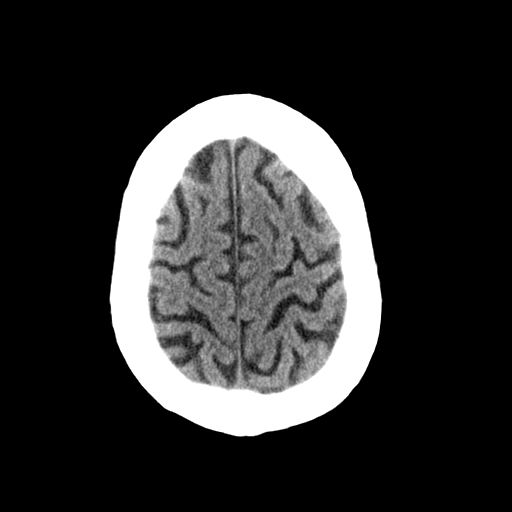
[im 30/33  brain]
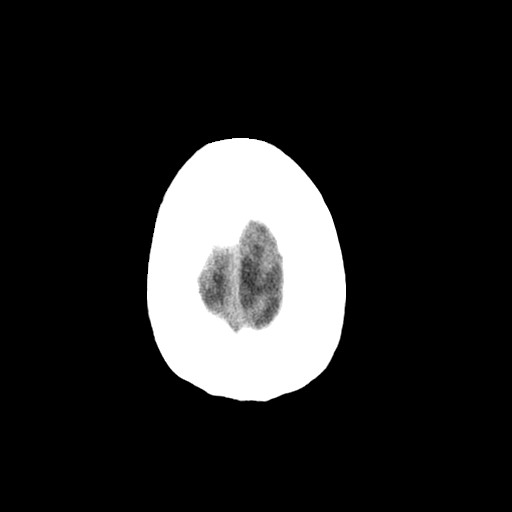

[Series 3: bone · axial · 0.45mm/px · z∈[-27,+53]mm · 5 of 33 slices shown]
[im 3/33  bone]
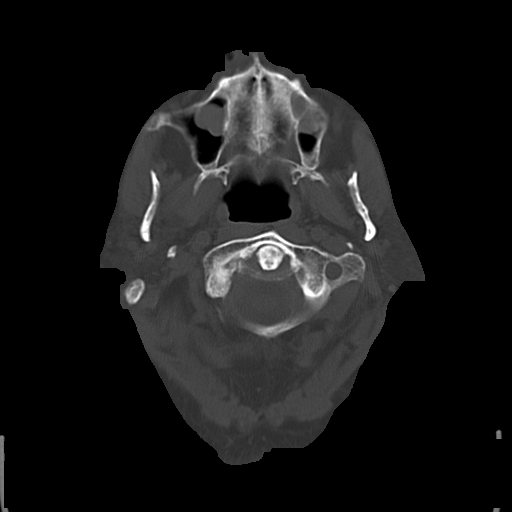
[im 9/33  bone]
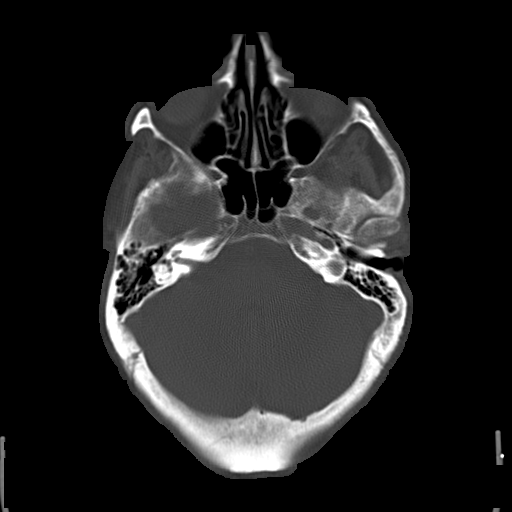
[im 11/33  bone]
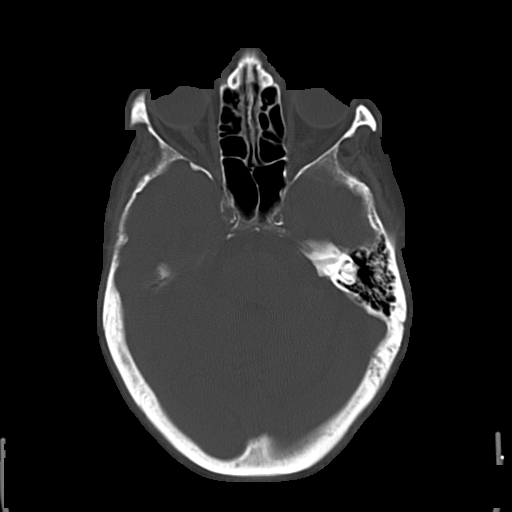
[im 14/33  bone]
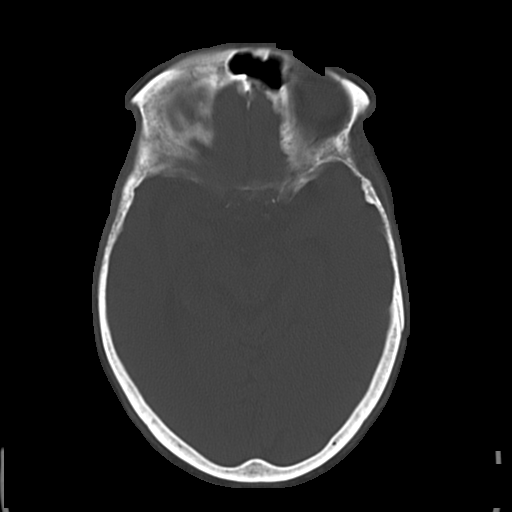
[im 19/33  bone]
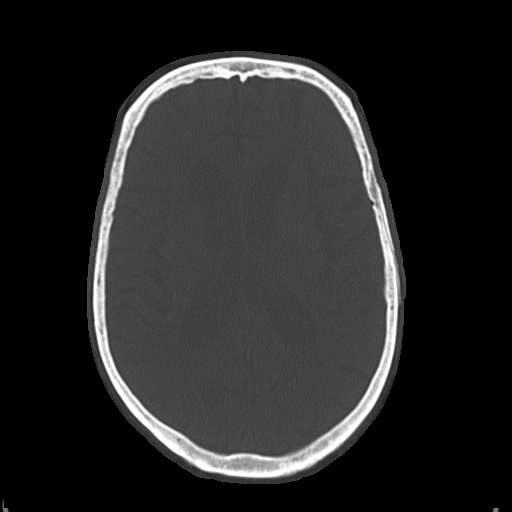

[16 of 30 positions shown; findings below may reference images not displayed]

FINDINGS: There is no evidence of mass effect, midline shift, or extra-axial fluid
collections.  There is no evidence of a space-occupying lesion or
intracranial hemorrhage. There is no evidence of a cortical-based area of
acute infarction. There is generalized cerebral atrophy. There is
periventricular white matter low attenuation likely secondary to
microangiopathy.

The ventricles and sulci are appropriate for the patient's age. The basal
cisterns are patent.

Visualized portions of the orbits are unremarkable. The visualized portions
of the paranasal sinuses and mastoid air cells are unremarkable.
Cerebrovascular atherosclerotic calcifications are noted.

The osseous structures are unremarkable.
IMPRESSION: No acute intracranial process.

[REDACTED]

## 2014-04-24 IMAGING — CR DG CHEST 1V PORT
1 series · 1 of 1 positions shown · non-contrast
Comparison: none

REASON FOR EXAM: fall
COMMENTS:

PROCEDURE:     DXR - DXR PORTABLE CHEST SINGLE VIEW  - September 06, 2012  [DATE]
RESULT:     Comparison: 09/08/2007

[ap]
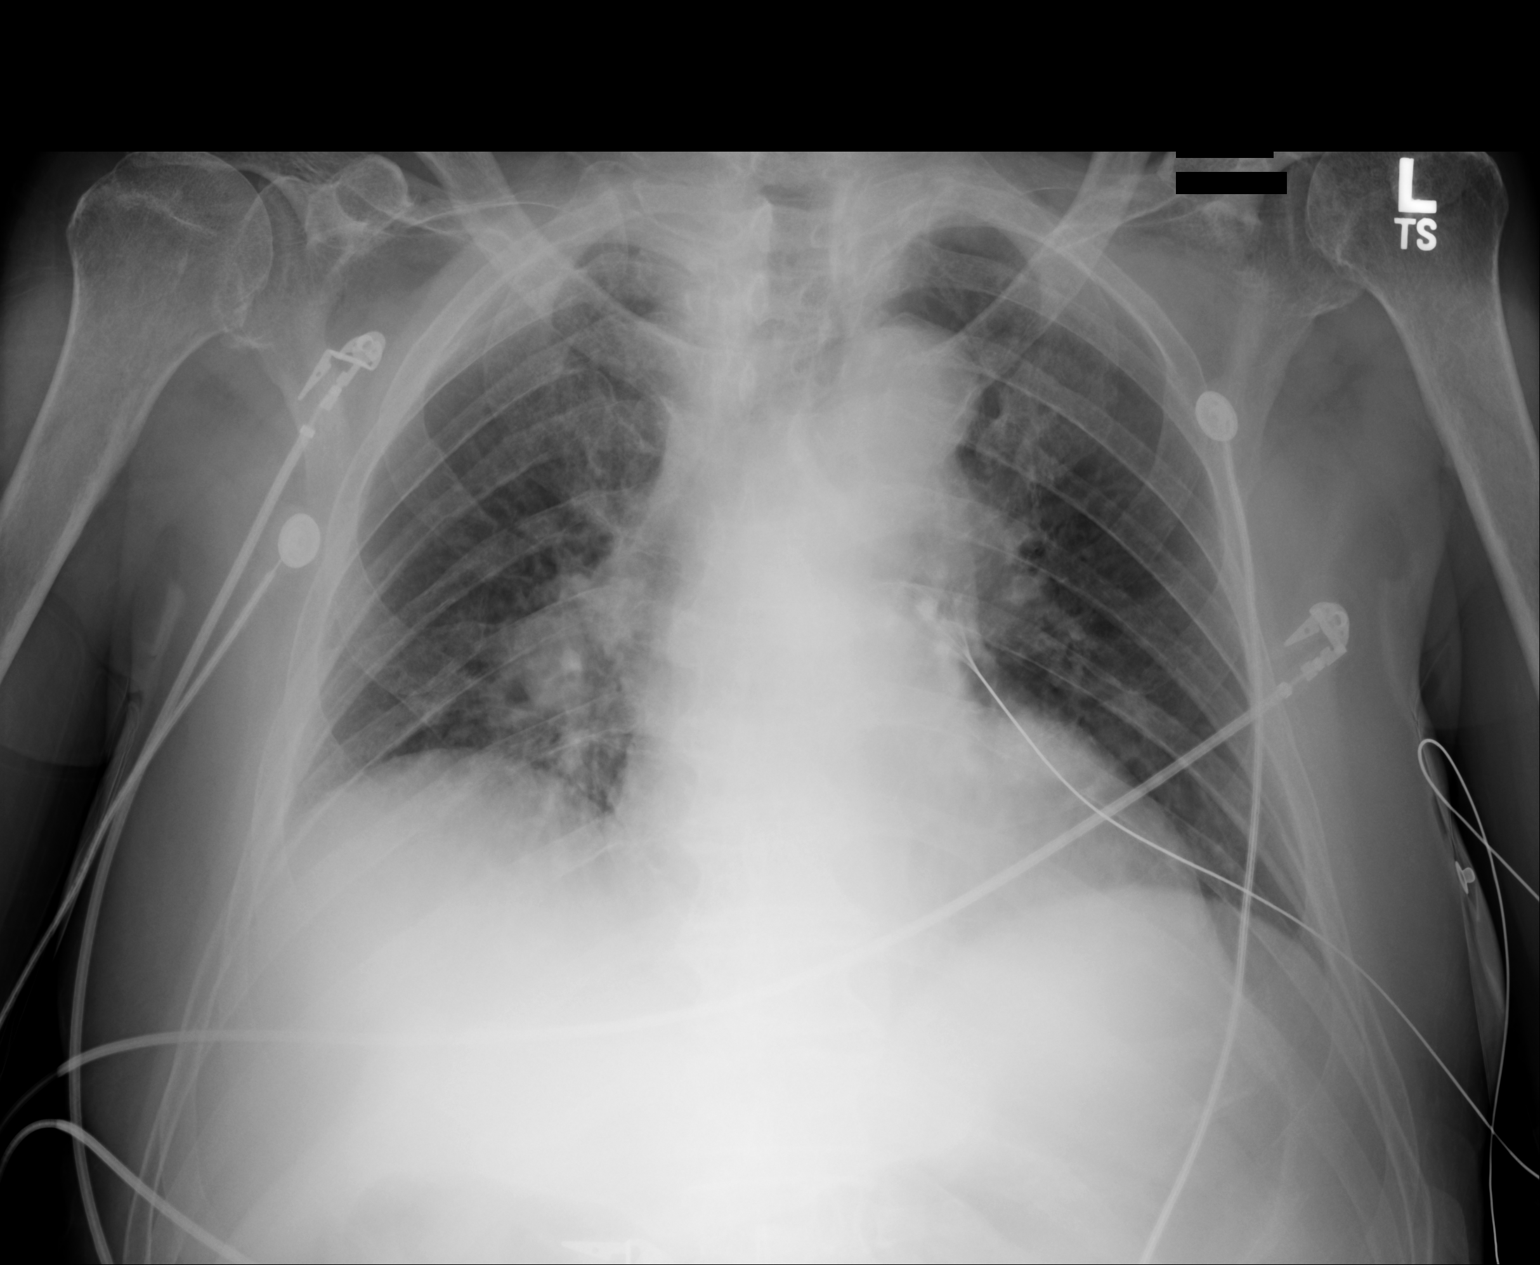

[1 of 1 positions shown; findings below may reference images not displayed]

FINDINGS: Heart size upper limits of normal to mildly enlarged. There is mild
prominence of the aortic arch. Bilateral hilar enlargement may be secondary
to enlarged central pulmonary vasculature. The lung volumes are low. Mild
basilar opacities are likely secondary to atelectasis. These are slightly
more focal and rounded in the left lower lung.
IMPRESSION: 1. Low lung volumes with mild basilar opacities that may be secondary to
atelectasis. However, these are slightly more focal and rounded in the left
lower lung. Followup PA and largest radiograph is recommended.
2. Mild hilar enlargement may secondary to patient rotation and enlarged
central pulmonary masseter. Lymphadenopathy is not excluded. Followup PA and
largest radiograph is recommended.

This was called to Dr. Neida Huff at 5161 hours 09/06/2012.

[REDACTED]

## 2014-12-12 NOTE — H&P (Signed)
    Subjective/Chief Complaint patient unresponsive    History of Present Illness The patient is an 81 yo man with htn and renal insufficiency who presented to the ER with unresponsiveness and was found to be in CHB with minimal escape. Transcutaneous pacing pads were placed and were intermittently capturing. He was hypotensive despite pressors. He had defecated on himself when he had syncope. He could give me know history.    Past History HTN CRE   Past Med/Surgical Hx:  HTN:   DM:   3 Blocked Arteries:   ALLERGIES:  NKA: None    Other Allergies unknown   HOME MEDICATIONS:  Medication Instructions Status  omeprazole 20 mg oral delayed release capsule 1 cap(s) orally once a day Active  simvastatin 80 mg oral tablet 1 tab(s) orally once a day (at bedtime) Active  lisinopril 5 mg oral tablet 1 tab(s) orally once a day Active   Family and Social History:   Family History Non-Contributory    Social History unknown   Review of Systems:   Subjective/Chief Complaint unable to provide history   Physical Exam:   GEN disheveled, critically ill appearing    HEENT dry oral mucosa, poor dentition    NECK supple  trachea midline    RESP normal resp effort  clear BS    CARD irregular brady    ABD soft    EXTR negative edema    SKIN normal to palpation    NEURO strength:, does not follow commands     Assessment/Admission Diagnosis 1. CHB 2. Chronic renal insufficiency 3. s/p emergent temporary transvenous PPM    Plan Transfer to a tertiary center for PPM. Additional treatment pending his clinical response to pacing. Treat metabolic abnormalities.   Electronic Signatures: Lewayne Buntingaylor, Giselle Brutus (MD)  (Signed 16-Jan-14 05:24)  Authored: CHIEF COMPLAINT and HISTORY, PAST MEDICAL/SURGIAL HISTORY, ALLERGIES, Other Allergies, HOME MEDICATIONS, FAMILY AND SOCIAL HISTORY, REVIEW OF SYSTEMS, PHYSICAL EXAM, ASSESSMENT AND PLAN   Last Updated: 16-Jan-14 05:24 by Lewayne Buntingaylor, Dwayne Bulkley (MD)

## 2014-12-12 NOTE — H&P (Signed)
PATIENT NAME:  William Collins, William Collins MR#:  [id number] DATE OF BIRTH:  [birth year]  DATE OF ADMISSION:  12-Sep-2012  PRIMARY CARE PHYSICIAN:  Unknown, however according to the records a few years ago, at least in 2009, Dr. Ellsworth Lennox was his doctor.   REFERRING PHYSICIAN:  Dr. Bayard Males.   CHIEF COMPLAINT:  Altered mental status and the patient was found on the floor.   HISTORY OF PRESENT ILLNESS:  The patient is an 81 year old Caucasian male who lives at home with his wife.  His wife apparently saw him on the floor and he was covered with feces.  She called EMS and patient was transported here to the Emergency Department.  En route, his heart rate noticed to be 30 per minute.  He received Atropine on the way to the Emergency Department and he did not respond, and upon arrival here he received another dose of Atropine  due to finding of bradycardia and complete heart block.  Again, he did not respond to that dose as well.  Transcutaneous pacer was placed and the cardiologist was consulted, and he is on his way to place a transvenous pacemaker.  The patient is not providing any meaningful history.  He is awake, but drowsy and confused.  According to the wife, she reported that lately he is unsteady on his feet and he is dizzy.  No report of chest pain.  I tried to reach the wife at least 5 times, however the home phone line is busy.  There are no records about this patient from 2009 until now.  All that we have in the past, that he has history of coronary artery disease, hypertension and diabetes.   REVIEW OF SYSTEMS:   A 10-point system review is unobtainable due to patient's altered mental status and confusion.  He is restless, mumbles a few words that are not intelligent.   PAST MEDICAL HISTORY:  According to the records, hypertension, coronary artery disease and diabetes mellitus.   PAST SURGICAL HISTORY:  Appendectomy.   FAMILY HISTORY:  According to the records, his father had diabetes mellitus and  his mother died of old age.   SOCIAL HISTORY:  He is married, living with his wife.   SOCIAL HABITS:  Nonsmoker.  No history of alcohol or drug abuse.   ADMISSION MEDICATIONS:  Unknown.  Again, the patient was brought from home by EMS.  No medication list was brought and I tried to reach his wife several times, but the line is busy.  I will keep trying.   ALLERGIES:  Again, according to records, no known drug allergies.   PHYSICAL EXAMINATION: VITAL SIGNS:  His blood pressure is low.  The latest blood pressure systolic was 70.  Pulse was in the 30s, but now about 70, while on transcutaneous pacing.  Respiratory rate is 20.  His O2 saturation was 88%.  GENERAL APPEARANCE:  Elderly male lying in bed.  He appears restless somewhat.  He would turn his face to the right or to the left every now and then.  He is incoherent.  HEAD AND NECK:  Mild pallor.  No icterus.  No cyanosis.   EARS, NOSE, THROAT:  Ear examination, hearing testing cannot be done due to patient's altered mental status.  But there is no visible bruise.  No bleeding.  No ulcers.  Nasal mucosa appears normal, no discharge, no bleeding.  No ulcers.  Visible part of the mouth, lips and tongue appears normal without lesions.  No oral thrush,  although I could not inspect the mouth adequately.  EYES:  Normal eyelids and conjunctivae.  Pupils about 4 mm, round, equal, could not see reactivity to light, maybe just sluggish.  NECK:  Supple.  Trachea at midline.  No thyromegaly.  No cervical lymphadenopathy.  HEART:  Normal S1, S2, very slow heart beat and faint heart sounds.  No murmur was appreciated.  No carotid bruits.  RESPIRATORY:  Normal breathing pattern without use of accessory muscles.  No rales.  No wheezing.  ABDOMEN:  Soft.  There is mid abdominal scar tissue from previous operation.  No hepatosplenomegaly.  No masses.  No hernias.  SKIN:  No ulcers.  No subcutaneous nodules.  MUSCULOSKELETAL:  No joint swelling.  No clubbing.   The left leg noticed to be slightly shorter than the right and is externally rotated; however, while I am examining the patient he moved this leg twice, flexing it and putting it down.  NEUROLOGIC:  Cranial nerve examination was limited due to altered mental status.  He has normal eye movements.  No facial asymmetry.  He is moving his upper and lower extremities.  PSYCHIATRIC:  Could not be done adequately due to patient's altered mental status.  Again, he is incoherent.   LABORATORY AND RADIOLOGICAL FINDINGS:  He had CAT scan of the head which showed no acute findings.  Also reported to me a CAT scan of the neck that it was negative for a fracture, but I do not have the report yet.  EKG shows a complete heart block with severe bradycardia.  There is only 2 beats in the rhythm strip to the wide complex.  There appears some P waves that are related to the QRS, consistent with complete heart block.  Serum glucose 320, BUN 33, creatinine 2.02, sodium 136, potassium 6.2.  His estimated GFR is 30, calcium 8.2, albumin 3.3, total protein 6.  Total CPK 47.  Troponin less than 0.02.  CBC showed white count of 15,000, hemoglobin 9.3, hematocrit 28.  Of note, in July 2011 his hemoglobin was 9.9 with hematocrit of 29.  His platelet count 127.  MCV elevated at 107.  Prothrombin time 13.  INR 1.  Urinalysis is not done yet.   ASSESSMENT: 1.  Syncope and hypotension.   2.  Complete heart block as the culprit for the above.  3.  Mild hyperkalemia.  4.  Chronic kidney disease, stage IV.  I am not sure if there is an element of acute renal failure on top of that.  I do not have a baseline creatinine on him.   5.  Mild hypocalcemia.  6.  Leukocytosis.  7.  Anemia, although his hemoglobin is at his baseline since 3 years ago.  8.  Thrombocytopenia.   9.  Diabetes mellitus type 2.  10.  History of systemic hypertension.   PLAN:  The patient will be admitted to the intensive care unit.  Right now he has transcutaneous  pacemaker.  The consulted cardiologist just arrived now and preparation in way to place transvenous pacemaker.  I ordered to give the patient 6 units of regular insulin to combat the hyperkalemia and also to improve the blood sugar.  We will follow up on the potassium level.  I will also give him one ampule of calcium gluconate 1 gram intravenously to correct the hypocalcemia and also combat the hyperkalemia, although hyperkalemia is not very severe.  I will continue to try to reach the wife to obtain information about him  and about his medications and also who is his primary care physician.  Right now I do not have any information about him, whether he has a LIVING WILL and what is his CODE STATUS.   TIME SPENT IN EVALUATING THIS PATIENT:  Took more than 55 minutes.     ____________________________ Carney Corners. Rudene Re, MD amd:ea D: 08/24/2012 04:53:39 ET T: 09/02/2012 06:04:40 ET JOB#: [id number]  cc: Carney Corners. Rudene Re, MD, <Dictator> Zollie Scale MD ELECTRONICALLY SIGNED 09/06/2012 21:46
